# Patient Record
Sex: Female | Born: 1940 | Race: Black or African American | Hispanic: No | Marital: Married | Smoking: Former smoker
Health system: Southern US, Community
[De-identification: ages and names within clinical notes are randomized; demographics above are authoritative.]

## PROBLEM LIST (undated history)

## (undated) DIAGNOSIS — T7840XA Allergy, unspecified, initial encounter: Secondary | ICD-10-CM

## (undated) DIAGNOSIS — N189 Chronic kidney disease, unspecified: Secondary | ICD-10-CM

## (undated) DIAGNOSIS — I10 Essential (primary) hypertension: Secondary | ICD-10-CM

## (undated) HISTORY — DX: Essential (primary) hypertension: I10

## (undated) HISTORY — PX: ABDOMINAL HYSTERECTOMY: SHX81

## (undated) HISTORY — DX: Chronic kidney disease, unspecified: N18.9

## (undated) HISTORY — PX: TONSILLECTOMY: SUR1361

## (undated) HISTORY — DX: Allergy, unspecified, initial encounter: T78.40XA

---

## 1997-11-03 ENCOUNTER — Other Ambulatory Visit: Admission: RE | Admit: 1997-11-03 | Discharge: 1997-11-03 | Payer: Self-pay | Admitting: *Deleted

## 1997-11-07 ENCOUNTER — Other Ambulatory Visit: Admission: RE | Admit: 1997-11-07 | Discharge: 1997-11-07 | Payer: Self-pay | Admitting: *Deleted

## 1997-11-28 ENCOUNTER — Ambulatory Visit (HOSPITAL_COMMUNITY): Admission: RE | Admit: 1997-11-28 | Discharge: 1997-11-28 | Payer: Self-pay | Admitting: *Deleted

## 1998-06-29 ENCOUNTER — Other Ambulatory Visit: Admission: RE | Admit: 1998-06-29 | Discharge: 1998-06-29 | Payer: Self-pay | Admitting: Obstetrics and Gynecology

## 2000-11-03 ENCOUNTER — Other Ambulatory Visit: Admission: RE | Admit: 2000-11-03 | Discharge: 2000-11-03 | Payer: Self-pay | Admitting: Obstetrics and Gynecology

## 2000-11-06 ENCOUNTER — Encounter: Payer: Self-pay | Admitting: Obstetrics and Gynecology

## 2000-11-06 ENCOUNTER — Encounter: Admission: RE | Admit: 2000-11-06 | Discharge: 2000-11-06 | Payer: Self-pay | Admitting: Obstetrics and Gynecology

## 2000-11-11 ENCOUNTER — Encounter: Admission: RE | Admit: 2000-11-11 | Discharge: 2000-11-11 | Payer: Self-pay | Admitting: Obstetrics and Gynecology

## 2000-11-11 ENCOUNTER — Other Ambulatory Visit: Admission: RE | Admit: 2000-11-11 | Discharge: 2000-11-11 | Payer: Self-pay | Admitting: *Deleted

## 2000-11-11 ENCOUNTER — Encounter: Payer: Self-pay | Admitting: Obstetrics and Gynecology

## 2000-11-11 ENCOUNTER — Encounter (INDEPENDENT_AMBULATORY_CARE_PROVIDER_SITE_OTHER): Payer: Self-pay | Admitting: Specialist

## 2010-08-17 ENCOUNTER — Ambulatory Visit: Admit: 2010-08-17 | Payer: Self-pay | Admitting: Family Medicine

## 2010-09-25 ENCOUNTER — Encounter: Payer: Self-pay | Admitting: Family Medicine

## 2010-09-25 ENCOUNTER — Ambulatory Visit (INDEPENDENT_AMBULATORY_CARE_PROVIDER_SITE_OTHER): Payer: BC Managed Care – PPO | Admitting: Family Medicine

## 2010-09-25 DIAGNOSIS — I10 Essential (primary) hypertension: Secondary | ICD-10-CM

## 2010-09-25 DIAGNOSIS — E785 Hyperlipidemia, unspecified: Secondary | ICD-10-CM

## 2010-09-25 DIAGNOSIS — J309 Allergic rhinitis, unspecified: Secondary | ICD-10-CM

## 2010-09-25 LAB — HEPATIC FUNCTION PANEL
ALT: 22 U/L (ref 0–35)
AST: 19 U/L (ref 0–37)
Albumin: 3.2 g/dL — ABNORMAL LOW (ref 3.5–5.2)
Alkaline Phosphatase: 99 U/L (ref 39–117)
Bilirubin, Direct: 0.1 mg/dL (ref 0.0–0.3)
Total Bilirubin: 0.8 mg/dL (ref 0.3–1.2)
Total Protein: 6.2 g/dL (ref 6.0–8.3)

## 2010-09-25 LAB — POCT URINALYSIS DIPSTICK
Bilirubin, UA: NEGATIVE
Glucose, UA: NEGATIVE
Ketones, UA: NEGATIVE
Leukocytes, UA: NEGATIVE
Nitrite, UA: NEGATIVE
Spec Grav, UA: 1.025
Urobilinogen, UA: 0.2
pH, UA: 5

## 2010-09-25 LAB — CBC WITH DIFFERENTIAL/PLATELET
Basophils Absolute: 0 10*3/uL (ref 0.0–0.1)
Eosinophils Absolute: 0.1 10*3/uL (ref 0.0–0.7)
HCT: 35 % — ABNORMAL LOW (ref 36.0–46.0)
Hemoglobin: 11.8 g/dL — ABNORMAL LOW (ref 12.0–15.0)
Lymphs Abs: 2 10*3/uL (ref 0.7–4.0)
MCHC: 33.6 g/dL (ref 30.0–36.0)
Neutro Abs: 3.7 10*3/uL (ref 1.4–7.7)
RDW: 13.6 % (ref 11.5–14.6)

## 2010-09-25 LAB — BASIC METABOLIC PANEL
BUN: 22 mg/dL (ref 6–23)
CO2: 22 mEq/L (ref 19–32)
Calcium: 9.2 mg/dL (ref 8.4–10.5)
Chloride: 107 mEq/L (ref 96–112)
Creatinine, Ser: 1.7 mg/dL — ABNORMAL HIGH (ref 0.4–1.2)
GFR: 37.26 mL/min — ABNORMAL LOW (ref >60.00)
Glucose, Bld: 83 mg/dL (ref 70–99)
Potassium: 4.1 mEq/L (ref 3.5–5.1)
Sodium: 139 mEq/L (ref 135–145)

## 2010-09-25 LAB — LIPID PANEL
Cholesterol: 287 mg/dL — ABNORMAL HIGH (ref 0–200)
HDL: 56.7 mg/dL (ref >39.00)
Total CHOL/HDL Ratio: 5
Triglycerides: 106 mg/dL (ref 0.0–149.0)
VLDL: 21.2 mg/dL (ref 0.0–40.0)

## 2010-09-25 MED ORDER — OLMESARTAN MEDOXOMIL-HCTZ 40-25 MG PO TABS: 1.0000 | ORAL_TABLET | Freq: Every day | ORAL | Status: DC

## 2010-09-25 NOTE — Progress Notes (Signed)
  Subjective:    Patient ID: Diane Martinez, female    DOB: [birth year], 70 y.o.   MRN: [id number]  HPI 70 yr old female to establish with [place] after she was referred to [place] by Dr. Stevphen Rochester for HTN. She has been seeing him for several months for allergies, and he noted her BP to be consistently elevated. She feels fine although she knows she is overweight. She does not exercise. She has not seen a primary care doctor for many years.    Review of Systems  Constitutional: Negative.   Respiratory: Negative.   Cardiovascular: Negative.        Objective:   Physical Exam  Constitutional: She appears well-developed and well-nourished.       Quite overweight  Neck: No thyromegaly present.  Cardiovascular: Normal rate, regular rhythm, normal heart sounds and intact distal pulses.  Exam reveals no gallop and no friction rub.   No murmur heard.      EKG normal  Pulmonary/Chest: Effort normal and breath sounds normal. No respiratory distress. She has no wheezes. She has no rales. She exhibits no tenderness.  Musculoskeletal: She exhibits no edema.          Assessment & Plan:  Start on meds and get fasting labs

## 2010-09-27 ENCOUNTER — Telehealth: Payer: Self-pay | Admitting: *Deleted

## 2010-09-27 NOTE — Telephone Encounter (Signed)
Message copied by Tor Netters on Thu Sep 27, 2010  3:54 PM ------      Message from: Dwaine Deter      Created: Tue Sep 25, 2010  5:29 PM       Normal except her chol and TG are very high, and her renal function is mildly decreased. This may be due to the HTN. Drink lots of water, exercise, watch a strict low fat diet. We will follow these closely

## 2010-09-27 NOTE — Telephone Encounter (Signed)
Left message to call back  

## 2010-10-08 NOTE — Telephone Encounter (Signed)
Pt aware of results 

## 2010-10-23 ENCOUNTER — Other Ambulatory Visit: Payer: Self-pay

## 2010-10-23 ENCOUNTER — Ambulatory Visit (INDEPENDENT_AMBULATORY_CARE_PROVIDER_SITE_OTHER): Payer: BC Managed Care – PPO | Admitting: Family Medicine

## 2010-10-23 ENCOUNTER — Encounter: Payer: Self-pay | Admitting: Family Medicine

## 2010-10-23 VITALS — BP 146/90 | Temp 98.4°F | Wt 213.0 lb

## 2010-10-23 DIAGNOSIS — E785 Hyperlipidemia, unspecified: Secondary | ICD-10-CM

## 2010-10-23 DIAGNOSIS — I10 Essential (primary) hypertension: Secondary | ICD-10-CM | POA: Insufficient documentation

## 2010-10-23 DIAGNOSIS — N289 Disorder of kidney and ureter, unspecified: Secondary | ICD-10-CM

## 2010-10-23 NOTE — Telephone Encounter (Signed)
Pt called to tell Dr Clent Ridges she wants to stay on same BP med and if you have not called refills in call to [address]

## 2010-10-23 NOTE — Progress Notes (Signed)
  Subjective:    Patient ID: Diane Martinez, female    DOB: [birth year], 70 y.o.   MRN: [id number]  HPI Here to follow up newly diagnosed HTN. For the past month she has been taking Benicar HCT, and she is doing well. She feels fine, and the BP is steadily coming down. She has made tremendous changes in her diet, and has almost totally removed all sources of sodium. Her recent labs were remarkable for a mildly elevated creatinine at 1.7 and for very high cholesterol with an LDL of over 200.    Review of Systems  Constitutional: Negative.   Respiratory: Negative.   Cardiovascular: Negative.        Objective:   Physical Exam  Constitutional: She appears well-developed and well-nourished.  Neck: No thyromegaly present.  Cardiovascular: Normal rate, regular rhythm, normal heart sounds and intact distal pulses.  Exam reveals no gallop and no friction rub.   No murmur heard. Pulmonary/Chest: Effort normal and breath sounds normal. No respiratory distress. She has no wheezes. She has no rales. She exhibits no tenderness.  Lymphadenopathy:    She has no cervical adenopathy.          Assessment & Plan:  Continue current meds and diet. Try to lose weight. Recheck with labs in 2 months

## 2010-10-24 MED ORDER — OLMESARTAN MEDOXOMIL-HCTZ 40-25 MG PO TABS: 1.0000 | ORAL_TABLET | Freq: Every day | ORAL | Status: DC

## 2010-10-24 NOTE — Telephone Encounter (Signed)
Call in Benicar HCT 40-25 qd, #30 with 11 rf

## 2010-10-24 NOTE — Telephone Encounter (Signed)
rx sent in to [facility] [address]  For benicar [phone]

## 2010-12-25 ENCOUNTER — Ambulatory Visit (INDEPENDENT_AMBULATORY_CARE_PROVIDER_SITE_OTHER): Payer: BC Managed Care – PPO | Admitting: Family Medicine

## 2010-12-25 ENCOUNTER — Encounter: Payer: Self-pay | Admitting: Family Medicine

## 2010-12-25 VITALS — BP 132/72 | HR 74 | Temp 98.0°F | Resp 14 | Wt 212.0 lb

## 2010-12-25 DIAGNOSIS — I10 Essential (primary) hypertension: Secondary | ICD-10-CM

## 2010-12-25 DIAGNOSIS — E785 Hyperlipidemia, unspecified: Secondary | ICD-10-CM

## 2010-12-25 LAB — BASIC METABOLIC PANEL
CO2: 25 mEq/L (ref 19–32)
GFR: 26.2 mL/min — ABNORMAL LOW (ref >60.00)
Glucose, Bld: 101 mg/dL — ABNORMAL HIGH (ref 70–99)
Potassium: 4.5 mEq/L (ref 3.5–5.1)
Sodium: 143 mEq/L (ref 135–145)

## 2010-12-25 LAB — LIPID PANEL: HDL: 48.1 mg/dL (ref >39.00)

## 2010-12-25 NOTE — Progress Notes (Signed)
  Subjective:    Patient ID: Diane Martinez, female    DOB: [birth year], 70 y.o.   MRN: [id number]  HPI Here to follow up on HTN and mild renal insufficiency. At her last visit her creatinine was 1.7 with a GFR of 37. She is watching a strict diet and drinking water.    Review of Systems  Constitutional: Negative.   Respiratory: Negative.   Cardiovascular: Negative.        Objective:   Physical Exam  Constitutional: She appears well-developed and well-nourished.  Neck: No thyromegaly present.  Cardiovascular: Normal rate, regular rhythm, normal heart sounds and intact distal pulses.   Pulmonary/Chest: Effort normal and breath sounds normal.  Lymphadenopathy:    She has no cervical adenopathy.          Assessment & Plan:  Check fasting labs today

## 2011-01-01 NOTE — Progress Notes (Signed)
Call placed to patient at [phone], she was advised per Dr Claris Che instructions and has verbalized understanding and agrees as instructed

## 2011-06-27 ENCOUNTER — Encounter: Payer: Self-pay | Admitting: Family Medicine

## 2011-06-27 ENCOUNTER — Ambulatory Visit (INDEPENDENT_AMBULATORY_CARE_PROVIDER_SITE_OTHER): Payer: BC Managed Care – PPO | Admitting: Family Medicine

## 2011-06-27 VITALS — BP 136/86 | HR 73 | Temp 98.7°F | Wt 214.0 lb

## 2011-06-27 DIAGNOSIS — N289 Disorder of kidney and ureter, unspecified: Secondary | ICD-10-CM

## 2011-06-27 DIAGNOSIS — I10 Essential (primary) hypertension: Secondary | ICD-10-CM

## 2011-06-27 NOTE — Progress Notes (Signed)
  Subjective:    Patient ID: Diane Martinez, female    DOB: 1941-04-21, 70 y.o.   MRN: [id number]  HPI Here for follow up. She feels fine and has no concerns. 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 We had intended to refer her to Nephrology last summer but it appears that this was not done.    Review of Systems  Constitutional: Negative.   Respiratory: Negative.   Cardiovascular: Negative.        Objective:   Physical Exam  Constitutional: She appears well-developed and well-nourished.  Cardiovascular: Normal rate, regular rhythm, normal heart sounds and intact distal pulses.   Pulmonary/Chest: Effort normal and breath sounds normal.          Assessment & Plan:  She is doing well. Try to lose some weight. Refer to Nephrology

## 2011-09-02 ENCOUNTER — Other Ambulatory Visit: Payer: Self-pay | Admitting: Nephrology

## 2011-09-02 DIAGNOSIS — N184 Chronic kidney disease, stage 4 (severe): Secondary | ICD-10-CM

## 2011-09-04 ENCOUNTER — Other Ambulatory Visit: Payer: BC Managed Care – PPO

## 2011-09-05 ENCOUNTER — Ambulatory Visit
Admission: RE | Admit: 2011-09-05 | Discharge: 2011-09-05 | Disposition: A | Discharge status: Home / Self Care | Payer: Medicare Other | Source: Ambulatory Visit | Attending: Nephrology | Admitting: Nephrology

## 2011-09-05 DIAGNOSIS — N184 Chronic kidney disease, stage 4 (severe): Secondary | ICD-10-CM

## 2011-09-27 ENCOUNTER — Ambulatory Visit (INDEPENDENT_AMBULATORY_CARE_PROVIDER_SITE_OTHER): Payer: Medicare Other | Admitting: Family Medicine

## 2011-09-27 ENCOUNTER — Encounter: Payer: Self-pay | Admitting: Family Medicine

## 2011-09-27 VITALS — BP 136/78 | HR 88 | Temp 98.2°F | Ht 65.0 in | Wt 196.0 lb

## 2011-09-27 DIAGNOSIS — N184 Chronic kidney disease, stage 4 (severe): Secondary | ICD-10-CM

## 2011-09-27 DIAGNOSIS — I10 Essential (primary) hypertension: Secondary | ICD-10-CM

## 2011-09-27 DIAGNOSIS — E538 Deficiency of other specified B group vitamins: Secondary | ICD-10-CM

## 2011-09-27 DIAGNOSIS — Z0000 Encounter for general adult medical examination without abnormal findings: Secondary | ICD-10-CM

## 2011-09-27 LAB — CBC WITH DIFFERENTIAL/PLATELET
Basophils Absolute: 0 10*3/uL (ref 0.0–0.1)
Lymphocytes Relative: 27.4 % (ref 12.0–46.0)
Monocytes Relative: 4.9 % (ref 3.0–12.0)
Neutrophils Relative %: 61.2 % (ref 43.0–77.0)
Platelets: 189 10*3/uL (ref 150.0–400.0)
RDW: 16.1 % — ABNORMAL HIGH (ref 11.5–14.6)

## 2011-09-27 LAB — HEPATIC FUNCTION PANEL
ALT: 14 U/L (ref 0–35)
Albumin: 3.6 g/dL (ref 3.5–5.2)
Total Bilirubin: 0.2 mg/dL — ABNORMAL LOW (ref 0.3–1.2)
Total Protein: 8.1 g/dL (ref 6.0–8.3)

## 2011-09-27 LAB — POCT URINALYSIS DIPSTICK
Ketones, UA: NEGATIVE
Leukocytes, UA: NEGATIVE
Nitrite, UA: NEGATIVE
Urobilinogen, UA: 0.2

## 2011-09-27 LAB — LIPID PANEL
Cholesterol: 291 mg/dL — ABNORMAL HIGH (ref 0–200)
Total CHOL/HDL Ratio: 5
Triglycerides: 103 mg/dL (ref 0.0–149.0)

## 2011-09-27 LAB — BASIC METABOLIC PANEL
CO2: 18 mEq/L — ABNORMAL LOW (ref 19–32)
Calcium: 9.4 mg/dL (ref 8.4–10.5)
Creatinine, Ser: 2.6 mg/dL — ABNORMAL HIGH (ref 0.4–1.2)

## 2011-09-27 LAB — VITAMIN B12: Vitamin B-12: 278 pg/mL (ref 211–911)

## 2011-09-27 LAB — TSH: TSH: 0.95 u[IU]/mL (ref 0.35–5.50)

## 2011-09-27 NOTE — Progress Notes (Signed)
  Subjective:    Patient ID: Diane Martinez, female    DOB: 10-07-1940, 71 y.o.   MRN: [id number]  HPI 71 yr old female for a cpx. She feels fine and has no concerns. She has been seeing Dr. Kathrene Bongo for kidney disease. She was switched from Benicar to Hydralazine, and her BP has been well controlled. She was also taken off all NSAIDs, and her proteinuria has been decreasing. She is due for a mammogram, and she has never had a colonoscopy. She recently retired and is enjoying her free time.    Review of Systems  Constitutional: Negative.   HENT: Negative.   Eyes: Negative.   Respiratory: Negative.   Cardiovascular: Negative.   Gastrointestinal: Negative.   Genitourinary: Negative for dysuria, urgency, frequency, hematuria, flank pain, decreased urine volume, enuresis, difficulty urinating, pelvic pain and dyspareunia.  Musculoskeletal: Negative.   Skin: Negative.   Neurological: Negative.   Hematological: Negative.   Psychiatric/Behavioral: Negative.        Objective:   Physical Exam  Constitutional: She is oriented to person, place, and time. She appears well-developed and well-nourished. No distress.  HENT:  Head: Normocephalic and atraumatic.  Right Ear: External ear normal.  Left Ear: External ear normal.  Nose: Nose normal.  Mouth/Throat: Oropharynx is clear and moist. No oropharyngeal exudate.  Eyes: Conjunctivae and EOM are normal. Pupils are equal, round, and reactive to light. No scleral icterus.  Neck: Normal range of motion. Neck supple. No JVD present. No thyromegaly present.  Cardiovascular: Normal rate, regular rhythm, normal heart sounds and intact distal pulses.  Exam reveals no gallop and no friction rub.   No murmur heard.      EKG normal with a single PVC  Pulmonary/Chest: Effort normal and breath sounds normal. No respiratory distress. She has no wheezes. She has no rales. She exhibits no tenderness.  Abdominal: Soft. Bowel sounds are normal. She exhibits  no distension and no mass. There is no tenderness. There is no rebound and no guarding.  Genitourinary: No breast swelling, tenderness, discharge or bleeding.  Musculoskeletal: Normal range of motion. She exhibits no edema and no tenderness.  Lymphadenopathy:    She has no cervical adenopathy.  Neurological: She is alert and oriented to person, place, and time. She has normal reflexes. No cranial nerve deficit. She exhibits normal muscle tone. Coordination normal.  Skin: Skin is warm and dry. No rash noted. No erythema.  Psychiatric: She has a normal mood and affect. Her behavior is normal. Judgment and thought content normal.          Assessment & Plan:  Well exam. Get fasting labs. She will set up a mammogram. We will set up a colonoscopy.

## 2011-10-02 ENCOUNTER — Encounter: Payer: Self-pay | Admitting: Gastroenterology

## 2011-10-02 ENCOUNTER — Encounter: Payer: Self-pay | Admitting: Family Medicine

## 2011-10-02 NOTE — Progress Notes (Signed)
Quick Note:  I tried to reach pt by phone, no answer. I put a copy of lab results in mail. ______

## 2011-11-05 ENCOUNTER — Ambulatory Visit (AMBULATORY_SURGERY_CENTER): Payer: Medicare Other

## 2011-11-05 VITALS — Ht 66.0 in | Wt 203.4 lb

## 2011-11-05 DIAGNOSIS — Z1211 Encounter for screening for malignant neoplasm of colon: Secondary | ICD-10-CM

## 2011-11-05 MED ORDER — PEG-KCL-NACL-NASULF-NA ASC-C 100 G PO SOLR: 1.0000 | Freq: Once | ORAL | Status: AC

## 2011-11-19 ENCOUNTER — Ambulatory Visit (AMBULATORY_SURGERY_CENTER): Payer: Medicare Other | Admitting: Gastroenterology

## 2011-11-19 ENCOUNTER — Encounter: Payer: Self-pay | Admitting: Gastroenterology

## 2011-11-19 VITALS — BP 171/82 | HR 67 | Temp 96.4°F | Resp 20 | Ht 66.0 in | Wt 203.0 lb

## 2011-11-19 DIAGNOSIS — Z1211 Encounter for screening for malignant neoplasm of colon: Secondary | ICD-10-CM

## 2011-11-19 DIAGNOSIS — K5730 Diverticulosis of large intestine without perforation or abscess without bleeding: Secondary | ICD-10-CM

## 2011-11-19 DIAGNOSIS — D126 Benign neoplasm of colon, unspecified: Secondary | ICD-10-CM

## 2011-11-19 HISTORY — PX: COLONOSCOPY: SHX174

## 2011-11-19 MED ORDER — SODIUM CHLORIDE 0.9 % IV SOLN: 500.0000 mL | INTRAVENOUS | Status: DC

## 2011-11-19 NOTE — Patient Instructions (Signed)
You may resume your prior medications today.  Handouts given on polyps, diverticulosis and high fiber diet.  Please call if any questions or concerns.    YOU HAD AN ENDOSCOPIC PROCEDURE TODAY AT THE [facility]: Refer to the procedure report that was given to you for any specific questions about what was found during the examination.  If the procedure report does not answer your questions, please call your gastroenterologist to clarify.  If you requested that your care partner not be given the details of your procedure findings, then the procedure report has been included in a sealed envelope for you to review at your convenience later.  YOU SHOULD EXPECT: Some feelings of bloating in the abdomen. Passage of more gas than usual.  Walking can help get rid of the air that was put into your GI tract during the procedure and reduce the bloating. If you had a lower endoscopy (such as a colonoscopy or flexible sigmoidoscopy) you may notice spotting of blood in your stool or on the toilet paper. If you underwent a bowel prep for your procedure, then you may not have a normal bowel movement for a few days.  DIET: Your first meal following the procedure should be a light meal and then it is ok to progress to your normal diet.  A half-sandwich or bowl of soup is an example of a good first meal.  Heavy or fried foods are harder to digest and may make you feel nauseous or bloated.  Likewise meals heavy in dairy and vegetables can cause extra gas to form and this can also increase the bloating.  Drink plenty of fluids but you should avoid alcoholic beverages for 24 hours.  ACTIVITY: Your care partner should take you home directly after the procedure.  You should plan to take it easy, moving slowly for the rest of the day.  You can resume normal activity the day after the procedure however you should NOT DRIVE or use heavy machinery for 24 hours (because of the sedation medicines used during the test).     SYMPTOMS TO REPORT IMMEDIATELY: A gastroenterologist can be reached at any hour.  During normal business hours, 8:30 AM to 5:00 PM Monday through Friday, call [phone].  After hours and on weekends, please call the GI answering service at [phone] who will take a message and have the physician on call contact you.   Following lower endoscopy (colonoscopy or flexible sigmoidoscopy):  Excessive amounts of blood in the stool  Significant tenderness or worsening of abdominal pains  Swelling of the abdomen that is new, acute  Fever of 100F or higher    FOLLOW UP: If any biopsies were taken you will be contacted by phone or by letter within the next 1-3 weeks.  Call your gastroenterologist if you have not heard about the biopsies in 3 weeks.  Our staff will call the home number listed on your records the next business day following your procedure to check on you and address any questions or concerns that you may have at that time regarding the information given to you following your procedure. This is a courtesy call and so if there is no answer at the home number and we have not heard from you through the emergency physician on call, we will assume that you have returned to your regular daily activities without incident.  SIGNATURES/CONFIDENTIALITY: You and/or your care partner have signed paperwork which will be entered into your electronic medical record.  These signatures attest to the fact that that the information above on your After Visit Summary has been reviewed and is understood.  Full responsibility of the confidentiality of this discharge information lies with you and/or your care-partner.  

## 2011-11-19 NOTE — Op Note (Signed)
[facility] [address] [place], [place]  [address]  COLONOSCOPY PROCEDURE REPORT  PATIENT:  Diane Martinez, Diane Martinez  MR#:  [id number] BIRTHDATE:  [birth year], 70 yrs. old  GENDER:  female ENDOSCOPIST:  Vania Rea. Jarold Motto, MD, [facility] REF. BY:  Tera Mater. Clent Ridges, M.D. PROCEDURE DATE:  11/19/2011 PROCEDURE:  Colonoscopy with snare polypectomy ASA CLASS:  Class III INDICATIONS:  Routine Risk Screening MEDICATIONS:   propofol (Diprivan) 200 mg IV  DESCRIPTION OF PROCEDURE:   After the risks and benefits and of the procedure were explained, informed consent was obtained. Digital rectal exam was performed and revealed no abnormalities. The LB PCF-H180AL [id number] endoscope was introduced through the anus and advanced to the cecum, which was identified by both the appendix and ileocecal valve.  The quality of the prep was excellent, using MoviPrep.  The instrument was then slowly withdrawn as the colon was fully examined. <<PROCEDUREIMAGES>>  FINDINGS:  ENDOSCOPIC FINDINGS:   A sessile polyp was found in the mid transverse colon.  cm flat TV COLON POLYP HOT SNARE REMOVED.SEE PICTURES. There were mild diverticular changes in left colon. diverticulosis was found.  This was otherwise a normal examination of the colon.   Retroflexed views in the rectum revealed no abnormalities.    The scope was then withdrawn from the patient and the procedure completed.  COMPLICATIONS:  None ENDOSCOPIC IMPRESSION: 1) Diverticulosis,mild,left sided diverticulosis 2) Sessile polyp in the mid transverse colon 3) Otherwise normal examination R/O ADENOMA RECOMMENDATIONS: 1) Await pathology results 2) High fiber diet.  REPEAT EXAM:  No  ______________________________ Vania Rea. Jarold Motto, MD, [facility]  CC:  n. eSIGNED:   Vania Rea. Jaylee Lantry at 11/19/2011 09:48 AM  Loura Pardon, [phone]

## 2011-11-19 NOTE — Progress Notes (Signed)
No cpomplaints noted in the recovery room. Maw  Patient did not experience any of the following events: a burn prior to discharge; a fall within the facility; wrong site/side/patient/procedure/implant event; or a hospital transfer or hospital admission upon discharge from the facility. [phone]) Patient did not have preoperative order for IV antibiotic SSI prophylaxis. [phone])

## 2011-11-19 NOTE — Progress Notes (Signed)
PROPOFOL PER [place] [facility] CRNA. SEE SCANNED INTRA PROCEDURE REPORT. MEDS TITRATED PER CRNA AMD MD. Brandy Hale

## 2011-11-20 ENCOUNTER — Telehealth: Payer: Self-pay | Admitting: *Deleted

## 2011-11-20 NOTE — Telephone Encounter (Signed)
  Follow up Call-  Call back number 11/19/2011  Post procedure Call Back phone  # [phone]  Permission to leave phone message Yes     Patient questions:  Do you have a fever, pain , or abdominal swelling? no Pain Score  0 *  Have you tolerated food without any problems? yes  Have you been able to return to your normal activities? yes  Do you have any questions about your discharge instructions: Diet   no Medications  no Follow up visit  no  Do you have questions or concerns about your Care? no  Actions: * If pain score is 4 or above: No action needed, pain <4.

## 2011-11-25 ENCOUNTER — Encounter: Payer: Self-pay | Admitting: Gastroenterology

## 2012-04-09 ENCOUNTER — Ambulatory Visit (INDEPENDENT_AMBULATORY_CARE_PROVIDER_SITE_OTHER): Payer: Medicare Other | Admitting: Family Medicine

## 2012-04-09 ENCOUNTER — Encounter: Payer: Self-pay | Admitting: Family Medicine

## 2012-04-09 VITALS — BP 146/88 | HR 76 | Temp 98.5°F | Wt 208.0 lb

## 2012-04-09 DIAGNOSIS — I10 Essential (primary) hypertension: Secondary | ICD-10-CM

## 2012-04-09 DIAGNOSIS — N289 Disorder of kidney and ureter, unspecified: Secondary | ICD-10-CM | POA: Insufficient documentation

## 2012-04-09 MED ORDER — HYDRALAZINE HCL 50 MG PO TABS: 50.0000 mg | ORAL_TABLET | Freq: Two times a day (BID) | ORAL | Status: DC

## 2012-04-09 NOTE — Progress Notes (Signed)
  Subjective:    Patient ID: Diane Martinez, female    DOB: Oct 16, 1940, 71 y.o.   MRN: [id number]  HPI Here to follow up on HTN. She feels great with no concerns. Her BP at home is stable in the 130s over 80s. She checks this 4-5 times every day. She sees Dr. Kathrene Bongo every 3 months.    Review of Systems  Constitutional: Negative.   Respiratory: Negative.   Cardiovascular: Negative.        Objective:   Physical Exam  Constitutional: She appears well-developed and well-nourished.  Neck: No thyromegaly present.  Cardiovascular: Normal rate, regular rhythm, normal heart sounds and intact distal pulses.   Pulmonary/Chest: Effort normal and breath sounds normal.  Lymphadenopathy:    She has no cervical adenopathy.          Assessment & Plan:  Stable HTN. Continue current regimen

## 2012-04-10 ENCOUNTER — Ambulatory Visit: Payer: Medicare Other | Admitting: Family Medicine

## 2012-09-28 ENCOUNTER — Encounter: Payer: Self-pay | Admitting: Family Medicine

## 2012-09-28 ENCOUNTER — Ambulatory Visit (INDEPENDENT_AMBULATORY_CARE_PROVIDER_SITE_OTHER): Payer: Medicare Other | Admitting: Family Medicine

## 2012-09-28 VITALS — BP 146/78 | HR 88 | Temp 97.7°F | Ht 65.5 in | Wt 204.0 lb

## 2012-09-28 DIAGNOSIS — E559 Vitamin D deficiency, unspecified: Secondary | ICD-10-CM

## 2012-09-28 DIAGNOSIS — J019 Acute sinusitis, unspecified: Secondary | ICD-10-CM

## 2012-09-28 DIAGNOSIS — N289 Disorder of kidney and ureter, unspecified: Secondary | ICD-10-CM

## 2012-09-28 DIAGNOSIS — J309 Allergic rhinitis, unspecified: Secondary | ICD-10-CM

## 2012-09-28 DIAGNOSIS — I10 Essential (primary) hypertension: Secondary | ICD-10-CM

## 2012-09-28 LAB — POCT URINALYSIS DIPSTICK
Bilirubin, UA: NEGATIVE
Ketones, UA: NEGATIVE
Leukocytes, UA: NEGATIVE

## 2012-09-28 LAB — CBC WITH DIFFERENTIAL/PLATELET
Basophils Absolute: 0 10*3/uL (ref 0.0–0.1)
Hemoglobin: 13.1 g/dL (ref 12.0–15.0)
Lymphocytes Relative: 28.5 % (ref 12.0–46.0)
Monocytes Relative: 7 % (ref 3.0–12.0)
Neutro Abs: 3.8 10*3/uL (ref 1.4–7.7)
RBC: 4.87 Mil/uL (ref 3.87–5.11)
RDW: 16.1 % — ABNORMAL HIGH (ref 11.5–14.6)

## 2012-09-28 LAB — HEPATIC FUNCTION PANEL: Total Bilirubin: 0.5 mg/dL (ref 0.3–1.2)

## 2012-09-28 LAB — TSH: TSH: 0.24 u[IU]/mL — ABNORMAL LOW (ref 0.35–5.50)

## 2012-09-28 LAB — LIPID PANEL
Cholesterol: 256 mg/dL — ABNORMAL HIGH (ref 0–200)
HDL: 45.6 mg/dL (ref >39.00)
VLDL: 18 mg/dL (ref 0.0–40.0)

## 2012-09-28 LAB — BASIC METABOLIC PANEL
BUN: 30 mg/dL — ABNORMAL HIGH (ref 6–23)
Calcium: 9.6 mg/dL (ref 8.4–10.5)
Creatinine, Ser: 2.3 mg/dL — ABNORMAL HIGH (ref 0.4–1.2)
GFR: 26.85 mL/min — ABNORMAL LOW (ref >60.00)

## 2012-09-28 MED ORDER — HYDROCODONE-HOMATROPINE 5-1.5 MG/5ML PO SYRP: 5.0000 mL | ORAL_SOLUTION | ORAL | Status: DC | PRN

## 2012-09-28 MED ORDER — AZITHROMYCIN 250 MG PO TABS: ORAL_TABLET | ORAL | Status: DC

## 2012-09-28 NOTE — Progress Notes (Signed)
  Subjective:    Patient ID: Diane Martinez, female    DOB: 30-Jul-1940, 72 y.o.   MRN: [id number]  HPI 72 yr old female for a cpx. She has been doing well until 2 weeks ago. Since then she has had a lot of PND and coughing up yellow sputum. No fever. On Allegra.    Review of Systems  Constitutional: Negative.   HENT: Positive for postnasal drip and sinus pressure. Negative for hearing loss, ear pain, nosebleeds, sore throat, facial swelling, rhinorrhea, sneezing, neck pain, neck stiffness, tinnitus and ear discharge.   Eyes: Negative.   Respiratory: Positive for cough. Negative for apnea, choking, chest tightness, shortness of breath, wheezing and stridor.   Cardiovascular: Negative.   Gastrointestinal: Negative.   Genitourinary: Negative for dysuria, urgency, frequency, hematuria, flank pain, decreased urine volume, enuresis, difficulty urinating, pelvic pain and dyspareunia.  Musculoskeletal: Negative.   Skin: Negative.   Neurological: Negative.   Psychiatric/Behavioral: Negative.        Objective:   Physical Exam  Constitutional: She is oriented to person, place, and time. She appears well-developed and well-nourished. No distress.  HENT:  Head: Normocephalic and atraumatic.  Right Ear: External ear normal.  Left Ear: External ear normal.  Nose: Nose normal.  Mouth/Throat: Oropharynx is clear and moist. No oropharyngeal exudate.  Eyes: Conjunctivae and EOM are normal. Pupils are equal, round, and reactive to light. No scleral icterus.  Neck: Normal range of motion. Neck supple. No JVD present. No thyromegaly present.  Cardiovascular: Normal rate, regular rhythm, normal heart sounds and intact distal pulses.  Exam reveals no gallop and no friction rub.   No murmur heard. EKG normal except frequent PACs and PVCs   Pulmonary/Chest: Effort normal and breath sounds normal. No respiratory distress. She has no wheezes. She has no rales. She exhibits no tenderness.  Abdominal: Soft.  Bowel sounds are normal. She exhibits no distension and no mass. There is no tenderness. There is no rebound and no guarding.  Musculoskeletal: Normal range of motion. She exhibits no edema and no tenderness.  Lymphadenopathy:    She has no cervical adenopathy.  Neurological: She is alert and oriented to person, place, and time. She has normal reflexes. No cranial nerve deficit. She exhibits normal muscle tone. Coordination normal.  Skin: Skin is warm and dry. No rash noted. No erythema.  Psychiatric: She has a normal mood and affect. Her behavior is normal. Judgment and thought content normal.          Assessment & Plan:  Well exam. Get fasting labs. Treat the sinusitis with a Zpack.

## 2012-09-29 NOTE — Progress Notes (Signed)
Quick Note:  I left voice message with results. ______ 

## 2013-03-31 ENCOUNTER — Encounter: Payer: Self-pay | Admitting: Family Medicine

## 2013-03-31 ENCOUNTER — Ambulatory Visit (INDEPENDENT_AMBULATORY_CARE_PROVIDER_SITE_OTHER): Payer: Medicare Other | Admitting: Family Medicine

## 2013-03-31 VITALS — BP 140/84 | HR 84 | Temp 98.2°F | Wt 211.0 lb

## 2013-03-31 DIAGNOSIS — N289 Disorder of kidney and ureter, unspecified: Secondary | ICD-10-CM

## 2013-03-31 DIAGNOSIS — I10 Essential (primary) hypertension: Secondary | ICD-10-CM

## 2013-03-31 NOTE — Progress Notes (Signed)
  Subjective:    Patient ID: Diane Martinez, female    DOB: 1941/02/11, 72 y.o.   MRN: [id number]  HPI Here for follow up. She feels well and her BP is stable. She saw Dr. Kathrene Bongo in July and she was very pleased with her renal status.   Review of Systems  Constitutional: Negative.   Respiratory: Negative.   Cardiovascular: Negative.        Objective:   Physical Exam  Constitutional: She appears well-developed and well-nourished.  Neck: No thyromegaly present.  Cardiovascular: Normal rate, regular rhythm, normal heart sounds and intact distal pulses.   Pulmonary/Chest: Effort normal and breath sounds normal.  Lymphadenopathy:    She has no cervical adenopathy.          Assessment & Plan:  She seems to be doing very well. Her CPX will be next March.

## 2013-09-29 ENCOUNTER — Encounter: Payer: Self-pay | Admitting: Family Medicine

## 2013-09-29 ENCOUNTER — Telehealth: Payer: Self-pay | Admitting: Family Medicine

## 2013-09-29 ENCOUNTER — Ambulatory Visit (INDEPENDENT_AMBULATORY_CARE_PROVIDER_SITE_OTHER): Payer: Medicare Other | Admitting: Family Medicine

## 2013-09-29 VITALS — BP 140/90 | HR 85 | Temp 98.1°F | Ht 65.5 in | Wt 200.0 lb

## 2013-09-29 DIAGNOSIS — Z0000 Encounter for general adult medical examination without abnormal findings: Secondary | ICD-10-CM

## 2013-09-29 DIAGNOSIS — I10 Essential (primary) hypertension: Secondary | ICD-10-CM

## 2013-09-29 LAB — HEPATIC FUNCTION PANEL
ALT: 18 U/L (ref 0–35)
AST: 20 U/L (ref 0–37)
Albumin: 3.6 g/dL (ref 3.5–5.2)
Alkaline Phosphatase: 85 U/L (ref 39–117)
BILIRUBIN DIRECT: 0.1 mg/dL (ref 0.0–0.3)
BILIRUBIN TOTAL: 0.8 mg/dL (ref 0.3–1.2)
Total Protein: 7.5 g/dL (ref 6.0–8.3)

## 2013-09-29 LAB — BASIC METABOLIC PANEL
BUN: 27 mg/dL — ABNORMAL HIGH (ref 6–23)
CO2: 27 mEq/L (ref 19–32)
Calcium: 9.8 mg/dL (ref 8.4–10.5)
Chloride: 100 mEq/L (ref 96–112)
Creatinine, Ser: 2.1 mg/dL — ABNORMAL HIGH (ref 0.4–1.2)
GFR: 30.07 mL/min — ABNORMAL LOW (ref >60.00)
Glucose, Bld: 93 mg/dL (ref 70–99)
POTASSIUM: 3.4 meq/L — AB (ref 3.5–5.1)
Sodium: 139 mEq/L (ref 135–145)

## 2013-09-29 LAB — CBC WITH DIFFERENTIAL/PLATELET
BASOS ABS: 0 10*3/uL (ref 0.0–0.1)
BASOS PCT: 0.3 % (ref 0.0–3.0)
EOS ABS: 0.1 10*3/uL (ref 0.0–0.7)
Eosinophils Relative: 1.6 % (ref 0.0–5.0)
HEMATOCRIT: 42.8 % (ref 36.0–46.0)
HEMOGLOBIN: 13.7 g/dL (ref 12.0–15.0)
LYMPHS ABS: 1.5 10*3/uL (ref 0.7–4.0)
Lymphocytes Relative: 24.2 % (ref 12.0–46.0)
MCHC: 32 g/dL (ref 30.0–36.0)
MCV: 85.6 fl (ref 78.0–100.0)
Monocytes Absolute: 0.4 10*3/uL (ref 0.1–1.0)
Monocytes Relative: 6.2 % (ref 3.0–12.0)
Neutro Abs: 4.1 10*3/uL (ref 1.4–7.7)
Neutrophils Relative %: 67.7 % (ref 43.0–77.0)
Platelets: 229 10*3/uL (ref 150.0–400.0)
RBC: 5 Mil/uL (ref 3.87–5.11)
RDW: 16.4 % — ABNORMAL HIGH (ref 11.5–14.6)
WBC: 6.1 10*3/uL (ref 4.5–10.5)

## 2013-09-29 LAB — POCT URINALYSIS DIPSTICK
BILIRUBIN UA: NEGATIVE
Blood, UA: NEGATIVE
Glucose, UA: NEGATIVE
KETONES UA: NEGATIVE
Leukocytes, UA: NEGATIVE
Nitrite, UA: NEGATIVE
PH UA: 5.5
Spec Grav, UA: 1.025
Urobilinogen, UA: 0.2

## 2013-09-29 LAB — TSH: TSH: 0.95 u[IU]/mL (ref 0.35–5.50)

## 2013-09-29 LAB — LIPID PANEL
CHOL/HDL RATIO: 4
CHOLESTEROL: 219 mg/dL — AB (ref 0–200)
HDL: 50.2 mg/dL (ref >39.00)
LDL CALC: 153 mg/dL — AB (ref 0–99)
Triglycerides: 78 mg/dL (ref 0.0–149.0)
VLDL: 15.6 mg/dL (ref 0.0–40.0)

## 2013-09-29 NOTE — Progress Notes (Signed)
   Subjective:    Patient ID: Diane Martinez, female    DOB: Jun 02, 1941, 73 y.o.   MRN: [id number]  HPI 73 yr old female for a cpx. She feels well.    Review of Systems  Constitutional: Negative.   HENT: Negative.   Eyes: Negative.   Respiratory: Negative.   Cardiovascular: Negative.   Gastrointestinal: Negative.   Genitourinary: Negative for dysuria, urgency, frequency, hematuria, flank pain, decreased urine volume, enuresis, difficulty urinating, pelvic pain and dyspareunia.  Musculoskeletal: Negative.   Skin: Negative.   Neurological: Negative.   Psychiatric/Behavioral: Negative.        Objective:   Physical Exam  Constitutional: She is oriented to person, place, and time. She appears well-developed and well-nourished. No distress.  HENT:  Head: Normocephalic and atraumatic.  Right Ear: External ear normal.  Left Ear: External ear normal.  Nose: Nose normal.  Mouth/Throat: Oropharynx is clear and moist. No oropharyngeal exudate.  Eyes: Conjunctivae and EOM are normal. Pupils are equal, round, and reactive to light. No scleral icterus.  Neck: Normal range of motion. Neck supple. No JVD present. No thyromegaly present.  Cardiovascular: Normal rate, regular rhythm, normal heart sounds and intact distal pulses.  Exam reveals no gallop and no friction rub.   No murmur heard. EKG normal with a single PVC  Pulmonary/Chest: Effort normal and breath sounds normal. No respiratory distress. She has no wheezes. She has no rales. She exhibits no tenderness.  Abdominal: Soft. Bowel sounds are normal. She exhibits no distension and no mass. There is no tenderness. There is no rebound and no guarding.  Musculoskeletal: Normal range of motion. She exhibits no edema and no tenderness.  Lymphadenopathy:    She has no cervical adenopathy.  Neurological: She is alert and oriented to person, place, and time. She has normal reflexes. No cranial nerve deficit. She exhibits normal muscle tone.  Coordination normal.  Skin: Skin is warm and dry. No rash noted. No erythema.  Psychiatric: She has a normal mood and affect. Her behavior is normal. Judgment and thought content normal.          Assessment & Plan:  Well exam. Get fasting labs.

## 2013-09-29 NOTE — Telephone Encounter (Signed)
Relevant patient education mailed to patient.  

## 2013-09-29 NOTE — Progress Notes (Signed)
Pre visit review using our clinic review tool, if applicable. No additional management support is needed unless otherwise documented below in the visit note. 

## 2013-10-04 MED ORDER — POTASSIUM CHLORIDE ER 10 MEQ PO TBCR: 10.0000 meq | EXTENDED_RELEASE_TABLET | Freq: Every day | ORAL | Status: DC

## 2013-10-04 NOTE — Addendum Note (Signed)
Addended by: Aggie Hacker A on: 10/04/2013 02:39 PM   Modules accepted: Orders

## 2014-03-29 ENCOUNTER — Ambulatory Visit: Payer: Medicare Other | Admitting: Family Medicine

## 2014-04-01 ENCOUNTER — Encounter: Payer: Self-pay | Admitting: Family Medicine

## 2014-04-01 ENCOUNTER — Ambulatory Visit (INDEPENDENT_AMBULATORY_CARE_PROVIDER_SITE_OTHER): Payer: Medicare Other | Admitting: Family Medicine

## 2014-04-01 VITALS — BP 147/81 | HR 86 | Temp 98.1°F | Ht 65.5 in | Wt 210.0 lb

## 2014-04-01 DIAGNOSIS — J3089 Other allergic rhinitis: Secondary | ICD-10-CM

## 2014-04-01 DIAGNOSIS — I10 Essential (primary) hypertension: Secondary | ICD-10-CM

## 2014-04-01 DIAGNOSIS — N289 Disorder of kidney and ureter, unspecified: Secondary | ICD-10-CM

## 2014-04-01 NOTE — Progress Notes (Signed)
Pre visit review using our clinic review tool, if applicable. No additional management support is needed unless otherwise documented below in the visit note. 

## 2014-04-01 NOTE — Progress Notes (Signed)
   Subjective:    Patient ID: Diane Martinez, female    DOB: 1941/07/09, 73 y.o.   MRN: [id number]  HPI Here to follow up. She feels great with no concerns. She still works part time.   Review of Systems  Constitutional: Negative.   Respiratory: Negative.   Cardiovascular: Negative.   Neurological: Negative.        Objective:   Physical Exam  Constitutional: She appears well-developed and well-nourished.  Cardiovascular: Normal rate, regular rhythm, normal heart sounds and intact distal pulses.   Pulmonary/Chest: Effort normal and breath sounds normal.          Assessment & Plan:  She is doing well.

## 2014-10-04 ENCOUNTER — Ambulatory Visit (INDEPENDENT_AMBULATORY_CARE_PROVIDER_SITE_OTHER): Payer: Medicare Other | Admitting: Family Medicine

## 2014-10-04 ENCOUNTER — Encounter: Payer: Self-pay | Admitting: Family Medicine

## 2014-10-04 VITALS — BP 151/89 | HR 78 | Temp 98.2°F | Ht 65.5 in | Wt 213.0 lb

## 2014-10-04 DIAGNOSIS — Z0000 Encounter for general adult medical examination without abnormal findings: Secondary | ICD-10-CM | POA: Diagnosis not present

## 2014-10-04 DIAGNOSIS — I10 Essential (primary) hypertension: Secondary | ICD-10-CM | POA: Diagnosis not present

## 2014-10-04 LAB — POCT URINALYSIS DIPSTICK
BILIRUBIN UA: NEGATIVE
Glucose, UA: NEGATIVE
Ketones, UA: NEGATIVE
Nitrite, UA: NEGATIVE
PH UA: 6
RBC UA: NEGATIVE
Spec Grav, UA: 1.02
Urobilinogen, UA: 0.2

## 2014-10-04 LAB — CBC WITH DIFFERENTIAL/PLATELET
BASOS ABS: 0 10*3/uL (ref 0.0–0.1)
Basophils Relative: 0.3 % (ref 0.0–3.0)
EOS PCT: 2.9 % (ref 0.0–5.0)
Eosinophils Absolute: 0.2 10*3/uL (ref 0.0–0.7)
HCT: 43.9 % (ref 36.0–46.0)
HEMOGLOBIN: 14.5 g/dL (ref 12.0–15.0)
LYMPHS PCT: 24.4 % (ref 12.0–46.0)
Lymphs Abs: 1.5 10*3/uL (ref 0.7–4.0)
MCHC: 33 g/dL (ref 30.0–36.0)
MCV: 83.5 fl (ref 78.0–100.0)
MONOS PCT: 5.3 % (ref 3.0–12.0)
Monocytes Absolute: 0.3 10*3/uL (ref 0.1–1.0)
NEUTROS ABS: 4.1 10*3/uL (ref 1.4–7.7)
Neutrophils Relative %: 67.1 % (ref 43.0–77.0)
Platelets: 202 10*3/uL (ref 150.0–400.0)
RBC: 5.26 Mil/uL — AB (ref 3.87–5.11)
RDW: 16.4 % — ABNORMAL HIGH (ref 11.5–15.5)
WBC: 6.1 10*3/uL (ref 4.0–10.5)

## 2014-10-04 LAB — BASIC METABOLIC PANEL
BUN: 32 mg/dL — AB (ref 6–23)
CALCIUM: 10 mg/dL (ref 8.4–10.5)
CO2: 28 meq/L (ref 19–32)
CREATININE: 1.74 mg/dL — AB (ref 0.40–1.20)
Chloride: 104 mEq/L (ref 96–112)
GFR: 36.84 mL/min — ABNORMAL LOW (ref >60.00)
Glucose, Bld: 97 mg/dL (ref 70–99)
Potassium: 3.6 mEq/L (ref 3.5–5.1)
Sodium: 139 mEq/L (ref 135–145)

## 2014-10-04 LAB — LIPID PANEL
Cholesterol: 235 mg/dL — ABNORMAL HIGH (ref 0–200)
HDL: 56.7 mg/dL (ref >39.00)
LDL Cholesterol: 159 mg/dL — ABNORMAL HIGH (ref 0–99)
NonHDL: 178.3
TRIGLYCERIDES: 97 mg/dL (ref 0.0–149.0)
Total CHOL/HDL Ratio: 4
VLDL: 19.4 mg/dL (ref 0.0–40.0)

## 2014-10-04 LAB — HEPATIC FUNCTION PANEL
ALK PHOS: 96 U/L (ref 39–117)
ALT: 19 U/L (ref 0–35)
AST: 16 U/L (ref 0–37)
Albumin: 3.9 g/dL (ref 3.5–5.2)
BILIRUBIN DIRECT: 0 mg/dL (ref 0.0–0.3)
BILIRUBIN TOTAL: 0.4 mg/dL (ref 0.2–1.2)
Total Protein: 8 g/dL (ref 6.0–8.3)

## 2014-10-04 LAB — TSH: TSH: 0.89 u[IU]/mL (ref 0.35–4.50)

## 2014-10-04 MED ORDER — HYDROCHLOROTHIAZIDE 25 MG PO TABS: 25.0000 mg | ORAL_TABLET | Freq: Every day | ORAL | Status: DC

## 2014-10-04 MED ORDER — HYDRALAZINE HCL 50 MG PO TABS: 50.0000 mg | ORAL_TABLET | Freq: Two times a day (BID) | ORAL | Status: DC

## 2014-10-04 MED ORDER — POTASSIUM CHLORIDE ER 10 MEQ PO TBCR: 10.0000 meq | EXTENDED_RELEASE_TABLET | Freq: Every day | ORAL | Status: DC

## 2014-10-04 NOTE — Progress Notes (Signed)
Pre visit review using our clinic review tool, if applicable. No additional management support is needed unless otherwise documented below in the visit note. 

## 2014-10-04 NOTE — Progress Notes (Signed)
   Subjective:    Patient ID: Diane Martinez, female    DOB: 1941-04-24, 74 y.o.   MRN: [id number]  HPI 74 yr old female for a cpx. She feels well and he rBP has been stable. She watches her diet and tries to walk several days a week.    Review of Systems  Constitutional: Negative.   HENT: Negative.   Eyes: Negative.   Respiratory: Negative.   Cardiovascular: Negative.   Gastrointestinal: Negative.   Genitourinary: Negative for dysuria, urgency, frequency, hematuria, flank pain, decreased urine volume, enuresis, difficulty urinating, pelvic pain and dyspareunia.  Musculoskeletal: Negative.   Skin: Negative.   Neurological: Negative.   Psychiatric/Behavioral: Negative.        Objective:   Physical Exam  Constitutional: She is oriented to person, place, and time. She appears well-developed and well-nourished. No distress.  HENT:  Head: Normocephalic and atraumatic.  Right Ear: External ear normal.  Left Ear: External ear normal.  Nose: Nose normal.  Mouth/Throat: Oropharynx is clear and moist. No oropharyngeal exudate.  Eyes: Conjunctivae and EOM are normal. Pupils are equal, round, and reactive to light. No scleral icterus.  Neck: Normal range of motion. Neck supple. No JVD present. No thyromegaly present.  Cardiovascular: Normal rate, regular rhythm, normal heart sounds and intact distal pulses.  Exam reveals no gallop and no friction rub.   No murmur heard. Pulmonary/Chest: Effort normal and breath sounds normal. No respiratory distress. She has no wheezes. She has no rales. She exhibits no tenderness.  Abdominal: Soft. Bowel sounds are normal. She exhibits no distension and no mass. There is no tenderness. There is no rebound and no guarding.  Musculoskeletal: Normal range of motion. She exhibits no edema or tenderness.  Lymphadenopathy:    She has no cervical adenopathy.  Neurological: She is alert and oriented to person, place, and time. She has normal reflexes. No cranial  nerve deficit. She exhibits normal muscle tone. Coordination normal.  Skin: Skin is warm and dry. No rash noted. No erythema.  Psychiatric: She has a normal mood and affect. Her behavior is normal. Judgment and thought content normal.          Assessment & Plan:  Well exam. Her HTN is well controlled. We will get fasting labs to day to check her renal function among other things

## 2014-10-13 ENCOUNTER — Other Ambulatory Visit: Payer: Self-pay | Admitting: Family Medicine

## 2015-01-02 ENCOUNTER — Telehealth: Payer: Self-pay

## 2015-01-02 NOTE — Telephone Encounter (Signed)
Left message for pt to call us back (concerning her overdue mammogram) 

## 2015-01-02 NOTE — Telephone Encounter (Signed)
Pt returned call and states she will get her mammogram next year.

## 2015-04-06 ENCOUNTER — Ambulatory Visit: Payer: Medicare Other | Admitting: Family Medicine

## 2015-04-13 ENCOUNTER — Ambulatory Visit (INDEPENDENT_AMBULATORY_CARE_PROVIDER_SITE_OTHER): Payer: Medicare Other | Admitting: Family Medicine

## 2015-04-13 ENCOUNTER — Encounter: Payer: Self-pay | Admitting: Family Medicine

## 2015-04-13 VITALS — BP 152/88 | HR 80 | Temp 98.2°F | Ht 65.5 in | Wt 216.0 lb

## 2015-04-13 DIAGNOSIS — N289 Disorder of kidney and ureter, unspecified: Secondary | ICD-10-CM | POA: Diagnosis not present

## 2015-04-13 DIAGNOSIS — I10 Essential (primary) hypertension: Secondary | ICD-10-CM

## 2015-04-13 LAB — BASIC METABOLIC PANEL
BUN: 32 mg/dL — AB (ref 6–23)
CO2: 26 meq/L (ref 19–32)
Calcium: 9.8 mg/dL (ref 8.4–10.5)
Chloride: 105 mEq/L (ref 96–112)
Creatinine, Ser: 1.76 mg/dL — ABNORMAL HIGH (ref 0.40–1.20)
GFR: 36.31 mL/min — ABNORMAL LOW (ref >60.00)
GLUCOSE: 92 mg/dL (ref 70–99)
POTASSIUM: 3.9 meq/L (ref 3.5–5.1)
Sodium: 139 mEq/L (ref 135–145)

## 2015-04-13 MED ORDER — POTASSIUM CHLORIDE ER 10 MEQ PO TBCR: 10.0000 meq | EXTENDED_RELEASE_TABLET | Freq: Every day | ORAL | Status: DC

## 2015-04-13 NOTE — Progress Notes (Signed)
Pre visit review using our clinic review tool, if applicable. No additional management support is needed unless otherwise documented below in the visit note. 

## 2015-04-13 NOTE — Progress Notes (Signed)
   Subjective:    Patient ID: Diane Martinez, female    DOB: 13-Jan-1941, 74 y.o.   MRN: [id number]  HPI Here to follow up. She feels great . She saw Dr. Moshe Cipro on 03-22-15 and she was very pleased with her progress. Her last creatinine in March was down to 1.74. Her BP remains stable (she has not taken her meds yet today).    Review of Systems  Constitutional: Negative.   Respiratory: Negative.   Cardiovascular: Negative.   Genitourinary: Negative.   Neurological: Negative.        Objective:   Physical Exam  Constitutional: She is oriented to person, place, and time. She appears well-developed and well-nourished.  Neck: No thyromegaly present.  Cardiovascular: Normal rate, regular rhythm, normal heart sounds and intact distal pulses.   Pulmonary/Chest: Effort normal and breath sounds normal.  Lymphadenopathy:    She has no cervical adenopathy.  Neurological: She is alert and oriented to person, place, and time.          Assessment & Plan:  She is doing well. Her HTN is stable. We will check her renal function and potassium level with a BMET today.

## 2015-10-11 ENCOUNTER — Encounter: Payer: Self-pay | Admitting: Family Medicine

## 2015-10-11 ENCOUNTER — Ambulatory Visit (INDEPENDENT_AMBULATORY_CARE_PROVIDER_SITE_OTHER): Payer: Medicare Other | Admitting: Family Medicine

## 2015-10-11 VITALS — BP 138/80 | HR 88 | Temp 98.2°F | Ht 65.5 in | Wt 213.0 lb

## 2015-10-11 DIAGNOSIS — Z0000 Encounter for general adult medical examination without abnormal findings: Secondary | ICD-10-CM

## 2015-10-11 DIAGNOSIS — I10 Essential (primary) hypertension: Secondary | ICD-10-CM

## 2015-10-11 LAB — POC URINALSYSI DIPSTICK (AUTOMATED)
BILIRUBIN UA: NEGATIVE
GLUCOSE UA: NEGATIVE
KETONES UA: NEGATIVE
Nitrite, UA: NEGATIVE
PH UA: 5.5
Spec Grav, UA: 1.02
Urobilinogen, UA: 0.2

## 2015-10-11 LAB — BASIC METABOLIC PANEL
BUN: 26 mg/dL — AB (ref 6–23)
CO2: 30 meq/L (ref 19–32)
Calcium: 10.1 mg/dL (ref 8.4–10.5)
Chloride: 102 mEq/L (ref 96–112)
Creatinine, Ser: 1.76 mg/dL — ABNORMAL HIGH (ref 0.40–1.20)
GFR: 36.26 mL/min — ABNORMAL LOW (ref >60.00)
GLUCOSE: 111 mg/dL — AB (ref 70–99)
POTASSIUM: 3.9 meq/L (ref 3.5–5.1)
SODIUM: 140 meq/L (ref 135–145)

## 2015-10-11 LAB — CBC WITH DIFFERENTIAL/PLATELET
BASOS ABS: 0 10*3/uL (ref 0.0–0.1)
BASOS PCT: 0.4 % (ref 0.0–3.0)
EOS PCT: 3.5 % (ref 0.0–5.0)
Eosinophils Absolute: 0.2 10*3/uL (ref 0.0–0.7)
HCT: 45.1 % (ref 36.0–46.0)
HEMOGLOBIN: 14.7 g/dL (ref 12.0–15.0)
LYMPHS ABS: 1.4 10*3/uL (ref 0.7–4.0)
LYMPHS PCT: 20.9 % (ref 12.0–46.0)
MCHC: 32.6 g/dL (ref 30.0–36.0)
MCV: 84.3 fl (ref 78.0–100.0)
MONOS PCT: 5.5 % (ref 3.0–12.0)
Monocytes Absolute: 0.4 10*3/uL (ref 0.1–1.0)
NEUTROS ABS: 4.8 10*3/uL (ref 1.4–7.7)
Neutrophils Relative %: 69.7 % (ref 43.0–77.0)
Platelets: 214 10*3/uL (ref 150.0–400.0)
RBC: 5.35 Mil/uL — AB (ref 3.87–5.11)
RDW: 16.9 % — ABNORMAL HIGH (ref 11.5–15.5)
WBC: 6.9 10*3/uL (ref 4.0–10.5)

## 2015-10-11 LAB — HEPATIC FUNCTION PANEL
ALBUMIN: 4 g/dL (ref 3.5–5.2)
ALT: 16 U/L (ref 0–35)
AST: 17 U/L (ref 0–37)
Alkaline Phosphatase: 112 U/L (ref 39–117)
Bilirubin, Direct: 0.1 mg/dL (ref 0.0–0.3)
TOTAL PROTEIN: 7.7 g/dL (ref 6.0–8.3)
Total Bilirubin: 0.4 mg/dL (ref 0.2–1.2)

## 2015-10-11 LAB — LIPID PANEL
CHOLESTEROL: 255 mg/dL — AB (ref 0–200)
HDL: 61 mg/dL (ref >39.00)
LDL Cholesterol: 167 mg/dL — ABNORMAL HIGH (ref 0–99)
NonHDL: 194.17
Total CHOL/HDL Ratio: 4
Triglycerides: 135 mg/dL (ref 0.0–149.0)
VLDL: 27 mg/dL (ref 0.0–40.0)

## 2015-10-11 LAB — TSH: TSH: 1.03 u[IU]/mL (ref 0.35–4.50)

## 2015-10-11 NOTE — Progress Notes (Signed)
   Subjective:    Patient ID: Diane Martinez, female    DOB: 1941-07-13, 75 y.o.   MRN: [id number]  HPI 75 yr old female for a cpx. She feels well. Her BP has been stable.    Review of Systems  Constitutional: Negative.   HENT: Negative.   Eyes: Negative.   Respiratory: Negative.   Cardiovascular: Negative.   Gastrointestinal: Negative.   Genitourinary: Negative for dysuria, urgency, frequency, hematuria, flank pain, decreased urine volume, enuresis, difficulty urinating, pelvic pain and dyspareunia.  Musculoskeletal: Negative.   Skin: Negative.   Neurological: Negative.   Psychiatric/Behavioral: Negative.        Objective:   Physical Exam  Constitutional: She is oriented to person, place, and time. She appears well-developed and well-nourished. No distress.  HENT:  Head: Normocephalic and atraumatic.  Right Ear: External ear normal.  Left Ear: External ear normal.  Nose: Nose normal.  Mouth/Throat: Oropharynx is clear and moist. No oropharyngeal exudate.  Eyes: Conjunctivae and EOM are normal. Pupils are equal, round, and reactive to light. No scleral icterus.  Neck: Normal range of motion. Neck supple. No JVD present. No thyromegaly present.  Cardiovascular: Normal rate, normal heart sounds and intact distal pulses.  Exam reveals no gallop and no friction rub.   No murmur heard. Frequent ectopy. EKG shows sinus with frequent PACs   Pulmonary/Chest: Effort normal and breath sounds normal. No respiratory distress. She has no wheezes. She has no rales. She exhibits no tenderness.  Abdominal: Soft. Bowel sounds are normal. She exhibits no distension and no mass. There is no tenderness. There is no rebound and no guarding.  Musculoskeletal: Normal range of motion. She exhibits no edema or tenderness.  Lymphadenopathy:    She has no cervical adenopathy.  Neurological: She is alert and oriented to person, place, and time. She has normal reflexes. No cranial nerve deficit. She  exhibits normal muscle tone. Coordination normal.  Skin: Skin is warm and dry. No rash noted. No erythema.  Psychiatric: She has a normal mood and affect. Her behavior is normal. Judgment and thought content normal.          Assessment & Plan:  Well exam. Get fasting labs. We discussed diet and exercise.

## 2015-10-11 NOTE — Progress Notes (Signed)
Pre visit review using our clinic review tool, if applicable. No additional management support is needed unless otherwise documented below in the visit note. 

## 2016-04-11 ENCOUNTER — Encounter: Payer: Self-pay | Admitting: Family Medicine

## 2016-04-11 ENCOUNTER — Ambulatory Visit (INDEPENDENT_AMBULATORY_CARE_PROVIDER_SITE_OTHER): Payer: Medicare Other | Admitting: Family Medicine

## 2016-04-11 VITALS — BP 163/95 | HR 84 | Temp 97.9°F | Ht 65.5 in | Wt 214.0 lb

## 2016-04-11 DIAGNOSIS — I10 Essential (primary) hypertension: Secondary | ICD-10-CM | POA: Diagnosis not present

## 2016-04-11 MED ORDER — POTASSIUM CHLORIDE ER 10 MEQ PO TBCR: 10.0000 meq | EXTENDED_RELEASE_TABLET | Freq: Every day | ORAL | 3 refills | Status: DC

## 2016-04-11 NOTE — Progress Notes (Signed)
   Subjective:    Patient ID: Diane Martinez, female    DOB: [birth year], 75 y.o.   MRN: [id number]  HPI Here to follow up on BP. She feels great and has no concerns. Her BP at home runs 110-134 over 80s .   Review of Systems  Constitutional: Negative.   Respiratory: Negative.   Cardiovascular: Negative.   Neurological: Negative.        Objective:   Physical Exam  Constitutional: She is oriented to person, place, and time. She appears well-nourished.  Neck: No thyromegaly present.  Cardiovascular: Normal rate, normal heart sounds and intact distal pulses.   No murmur heard. Occasional ectopy   Pulmonary/Chest: Effort normal and breath sounds normal.  Musculoskeletal: She exhibits no edema.  Lymphadenopathy:    She has no cervical adenopathy.  Neurological: She is alert and oriented to person, place, and time.          Assessment & Plan:  HTN is well controlled. She will return in 6 months.  Laurey Morale, MD

## 2016-04-11 NOTE — Progress Notes (Signed)
Pre visit review using our clinic review tool, if applicable. No additional management support is needed unless otherwise documented below in the visit note. 

## 2016-04-30 ENCOUNTER — Other Ambulatory Visit: Payer: Self-pay | Admitting: Family Medicine

## 2016-10-09 ENCOUNTER — Encounter: Payer: Self-pay | Admitting: Family Medicine

## 2016-10-09 ENCOUNTER — Ambulatory Visit (INDEPENDENT_AMBULATORY_CARE_PROVIDER_SITE_OTHER): Payer: Medicare Other | Admitting: Family Medicine

## 2016-10-09 VITALS — BP 150/90 | HR 77 | Temp 97.9°F | Ht 65.5 in | Wt 204.0 lb

## 2016-10-09 DIAGNOSIS — N289 Disorder of kidney and ureter, unspecified: Secondary | ICD-10-CM

## 2016-10-09 DIAGNOSIS — R739 Hyperglycemia, unspecified: Secondary | ICD-10-CM

## 2016-10-09 DIAGNOSIS — I10 Essential (primary) hypertension: Secondary | ICD-10-CM

## 2016-10-09 LAB — BASIC METABOLIC PANEL
BUN: 27 mg/dL — ABNORMAL HIGH (ref 6–23)
CALCIUM: 10.2 mg/dL (ref 8.4–10.5)
CHLORIDE: 104 meq/L (ref 96–112)
CO2: 29 mEq/L (ref 19–32)
CREATININE: 1.75 mg/dL — AB (ref 0.40–1.20)
GFR: 36.4 mL/min — AB (ref >60.00)
Glucose, Bld: 91 mg/dL (ref 70–99)
Potassium: 4.1 mEq/L (ref 3.5–5.1)
Sodium: 141 mEq/L (ref 135–145)

## 2016-10-09 LAB — LIPID PANEL
CHOL/HDL RATIO: 5
Cholesterol: 252 mg/dL — ABNORMAL HIGH (ref 0–200)
HDL: 53.4 mg/dL (ref >39.00)
LDL CALC: 177 mg/dL — AB (ref 0–99)
NonHDL: 198.52
Triglycerides: 109 mg/dL (ref 0.0–149.0)
VLDL: 21.8 mg/dL (ref 0.0–40.0)

## 2016-10-09 LAB — CBC WITH DIFFERENTIAL/PLATELET
BASOS ABS: 0 10*3/uL (ref 0.0–0.1)
Basophils Relative: 0.3 % (ref 0.0–3.0)
Eosinophils Absolute: 0.2 10*3/uL (ref 0.0–0.7)
Eosinophils Relative: 2.8 % (ref 0.0–5.0)
HCT: 43.4 % (ref 36.0–46.0)
Hemoglobin: 13.9 g/dL (ref 12.0–15.0)
LYMPHS ABS: 1.5 10*3/uL (ref 0.7–4.0)
Lymphocytes Relative: 23.6 % (ref 12.0–46.0)
MCHC: 32 g/dL (ref 30.0–36.0)
MCV: 84.8 fl (ref 78.0–100.0)
MONO ABS: 0.4 10*3/uL (ref 0.1–1.0)
MONOS PCT: 5.9 % (ref 3.0–12.0)
NEUTROS PCT: 67.4 % (ref 43.0–77.0)
Neutro Abs: 4.2 10*3/uL (ref 1.4–7.7)
PLATELETS: 227 10*3/uL (ref 150.0–400.0)
RBC: 5.12 Mil/uL — AB (ref 3.87–5.11)
RDW: 15.9 % — ABNORMAL HIGH (ref 11.5–15.5)
WBC: 6.2 10*3/uL (ref 4.0–10.5)

## 2016-10-09 LAB — POC URINALSYSI DIPSTICK (AUTOMATED)
Bilirubin, UA: NEGATIVE
Blood, UA: NEGATIVE
Glucose, UA: NEGATIVE
KETONES UA: NEGATIVE
Nitrite, UA: NEGATIVE
PH UA: 5.5
Spec Grav, UA: 1.015
UROBILINOGEN UA: 0.2

## 2016-10-09 LAB — TSH: TSH: 0.94 u[IU]/mL (ref 0.35–4.50)

## 2016-10-09 LAB — HEMOGLOBIN A1C: Hgb A1c MFr Bld: 6.1 % (ref 4.6–6.5)

## 2016-10-09 LAB — HEPATIC FUNCTION PANEL
ALT: 15 U/L (ref 0–35)
AST: 17 U/L (ref 0–37)
Albumin: 4.1 g/dL (ref 3.5–5.2)
Alkaline Phosphatase: 110 U/L (ref 39–117)
Bilirubin, Direct: 0.1 mg/dL (ref 0.0–0.3)
Total Bilirubin: 0.5 mg/dL (ref 0.2–1.2)
Total Protein: 7.7 g/dL (ref 6.0–8.3)

## 2016-10-09 NOTE — Progress Notes (Signed)
Pre visit review using our clinic review tool, if applicable. No additional management support is needed unless otherwise documented below in the visit note. 

## 2016-10-09 NOTE — Patient Instructions (Signed)
WE NOW OFFER   [facility] FAST TRACK!!!  SAME DAY Appointments for ACUTE CARE  Such as: Sprains, Injuries, cuts, abrasions, rashes, muscle pain, joint pain, back pain Colds, flu, sore throats, headache, allergies, cough, fever  Ear pain, sinus and eye infections Abdominal pain, nausea, vomiting, diarrhea, upset stomach Animal/insect bites  3 Easy Ways to Schedule: Walk-In Scheduling Call in scheduling Mychart Sign-up: [email or web address]         

## 2016-10-09 NOTE — Progress Notes (Signed)
   Subjective:    Patient ID: Diane Martinez, female    DOB: 1941/06/15, 76 y.o.   MRN: [id number]  HPI 76 yr old female to follow up. She feels well and has no complaints. Her BP at home is stable, in fact this am she got 126/66. She stays active and she still works part time.    Review of Systems  Constitutional: Negative.   HENT: Negative.   Eyes: Negative.   Respiratory: Negative.   Cardiovascular: Negative.   Gastrointestinal: Negative.   Genitourinary: Negative for decreased urine volume, difficulty urinating, dyspareunia, dysuria, enuresis, flank pain, frequency, hematuria, pelvic pain and urgency.  Musculoskeletal: Negative.   Skin: Negative.   Neurological: Negative.   Psychiatric/Behavioral: Negative.        Objective:   Physical Exam  Constitutional: She is oriented to person, place, and time. She appears well-developed and well-nourished. No distress.  HENT:  Head: Normocephalic and atraumatic.  Right Ear: External ear normal.  Left Ear: External ear normal.  Nose: Nose normal.  Mouth/Throat: Oropharynx is clear and moist. No oropharyngeal exudate.  Eyes: Conjunctivae and EOM are normal. Pupils are equal, round, and reactive to light. No scleral icterus.  Neck: Normal range of motion. Neck supple. No JVD present. No thyromegaly present.  Cardiovascular: Normal rate, regular rhythm, normal heart sounds and intact distal pulses.  Exam reveals no gallop and no friction rub.   No murmur heard. Pulmonary/Chest: Effort normal and breath sounds normal. No respiratory distress. She has no wheezes. She has no rales. She exhibits no tenderness.  Abdominal: Soft. Bowel sounds are normal. She exhibits no distension and no mass. There is no tenderness. There is no rebound and no guarding.  Musculoskeletal: Normal range of motion. She exhibits no edema or tenderness.  Lymphadenopathy:    She has no cervical adenopathy.  Neurological: She is alert and oriented to person, place,  and time. She has normal reflexes. No cranial nerve deficit. She exhibits normal muscle tone. Coordination normal.  Skin: Skin is warm and dry. No rash noted. No erythema.  Psychiatric: She has a normal mood and affect. Her behavior is normal. Judgment and thought content normal.          Assessment & Plan:  Her HTN is stable. We will send her for fasting labs today to check her renal function among other things.  Alysia Penna, MD

## 2017-02-16 ENCOUNTER — Other Ambulatory Visit: Payer: Self-pay | Admitting: Family Medicine

## 2017-02-17 NOTE — Telephone Encounter (Signed)
Can we refill this? 

## 2017-03-17 ENCOUNTER — Ambulatory Visit: Payer: Medicare Other | Admitting: Family Medicine

## 2017-03-17 ENCOUNTER — Telehealth: Payer: Self-pay | Admitting: Family Medicine

## 2017-03-17 NOTE — Telephone Encounter (Signed)
Dr. Sarajane Jews received a fax from pt's eye doctor, Dr. Katy Fitch and he states her doctor found something upon exam that he wanted Dr. Sarajane Jews to follow up on. Per Dr. Sarajane Jews he wanted to see if pt could come in today to see him.   Unable to reach pt. Left vm to return our call

## 2017-03-17 NOTE — Telephone Encounter (Signed)
Looks like pt rescheduled her appt and she will be coming into the office on 8/21. Letter from Dr. Zenia Resides office placed with Dr. Barbie Banner papers on the counter

## 2017-03-17 NOTE — Telephone Encounter (Signed)
Pt has been sch for today at 3 pm

## 2017-03-17 NOTE — Telephone Encounter (Signed)
Dr. Sarajane Jews is aware that she is not coming in today as well

## 2017-03-18 ENCOUNTER — Ambulatory Visit (INDEPENDENT_AMBULATORY_CARE_PROVIDER_SITE_OTHER): Payer: Medicare Other | Admitting: Family Medicine

## 2017-03-18 ENCOUNTER — Encounter: Payer: Self-pay | Admitting: Family Medicine

## 2017-03-18 VITALS — BP 140/90 | HR 94 | Temp 98.0°F | Ht 65.5 in | Wt 201.0 lb

## 2017-03-18 DIAGNOSIS — E782 Mixed hyperlipidemia: Secondary | ICD-10-CM

## 2017-03-18 DIAGNOSIS — G3281 Cerebellar ataxia in diseases classified elsewhere: Secondary | ICD-10-CM | POA: Diagnosis not present

## 2017-03-18 DIAGNOSIS — I10 Essential (primary) hypertension: Secondary | ICD-10-CM | POA: Diagnosis not present

## 2017-03-18 DIAGNOSIS — H34211 Partial retinal artery occlusion, right eye: Secondary | ICD-10-CM | POA: Insufficient documentation

## 2017-03-18 DIAGNOSIS — E785 Hyperlipidemia, unspecified: Secondary | ICD-10-CM | POA: Insufficient documentation

## 2017-03-18 LAB — BASIC METABOLIC PANEL
BUN: 26 mg/dL — AB (ref 6–23)
CO2: 31 mEq/L (ref 19–32)
Calcium: 9.5 mg/dL (ref 8.4–10.5)
Chloride: 102 mEq/L (ref 96–112)
Creatinine, Ser: 1.82 mg/dL — ABNORMAL HIGH (ref 0.40–1.20)
GFR: 34.75 mL/min — AB (ref >60.00)
Glucose, Bld: 117 mg/dL — ABNORMAL HIGH (ref 70–99)
POTASSIUM: 3.6 meq/L (ref 3.5–5.1)
SODIUM: 139 meq/L (ref 135–145)

## 2017-03-18 MED ORDER — ASPIRIN 81 MG PO TABS: 81.0000 mg | ORAL_TABLET | Freq: Every day | ORAL | 0 refills | Status: AC

## 2017-03-18 MED ORDER — HYDROCHLOROTHIAZIDE 25 MG PO TABS: 25.0000 mg | ORAL_TABLET | Freq: Every day | ORAL | 3 refills | Status: DC

## 2017-03-18 MED ORDER — ATORVASTATIN CALCIUM 20 MG PO TABS: 20.0000 mg | ORAL_TABLET | Freq: Every day | ORAL | 3 refills | Status: DC

## 2017-03-18 NOTE — Progress Notes (Signed)
   Subjective:    Patient ID: Diane Martinez, female    DOB: December 31, 1940, 76 y.o.   MRN: [id number]  HPI Here at the request of Dr. Clent Jacks, her ophthalmologist. 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 This is associated with a higher risk of other CNS emboli than normal, so he asked [place] to evaluate her. She feels fine and her vision is not affected. Her lipids have been borderline and she has been using red yeast rice extract but she has never taken a statin. She is not on aspirin. Her BP at home has been stable in the 120s over 70s.    Review of Systems  Constitutional: Negative.   Eyes: Negative.   Respiratory: Negative.   Cardiovascular: Negative.   Neurological: Negative.        Objective:   Physical Exam  Constitutional: She is oriented to person, place, and time. She appears well-developed and well-nourished.  Neck: No thyromegaly present.  No carotid bruits   Cardiovascular: Normal rate, regular rhythm, normal heart sounds and intact distal pulses.   Occasional ectopy   Pulmonary/Chest: Effort normal and breath sounds normal. No respiratory distress. She has no wheezes. She has no rales.  Lymphadenopathy:    She has no cervical adenopathy.  Neurological: She is alert and oriented to person, place, and time.          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 She will begin taking aspirin 81 mg daily. She will start taking Lipitor 20 mg daily. Get a BMET today. Set up carotid dopplers, a brain MRI scan, and an ECHO soon to look for potential sources of emboli.  Alysia Penna, MD

## 2017-03-18 NOTE — Patient Instructions (Signed)
WE NOW OFFER   [facility] FAST TRACK!!!  SAME DAY Appointments for ACUTE CARE  Such as: Sprains, Injuries, cuts, abrasions, rashes, muscle pain, joint pain, back pain Colds, flu, sore throats, headache, allergies, cough, fever  Ear pain, sinus and eye infections Abdominal pain, nausea, vomiting, diarrhea, upset stomach Animal/insect bites  3 Easy Ways to Schedule: Walk-In Scheduling Call in scheduling Mychart Sign-up: [email or web address]         

## 2017-03-20 ENCOUNTER — Telehealth: Payer: Self-pay | Admitting: Family Medicine

## 2017-03-20 NOTE — Telephone Encounter (Signed)
I left a voice message for pt to return my call. Had lab results to discuss, chart was closed by accident.

## 2017-03-21 NOTE — Telephone Encounter (Signed)
I spoke with pt and went over results. 

## 2017-03-24 ENCOUNTER — Ambulatory Visit (HOSPITAL_COMMUNITY): Payer: Medicare Other | Attending: Cardiology

## 2017-03-24 ENCOUNTER — Other Ambulatory Visit: Payer: Self-pay

## 2017-03-24 DIAGNOSIS — I129 Hypertensive chronic kidney disease with stage 1 through stage 4 chronic kidney disease, or unspecified chronic kidney disease: Secondary | ICD-10-CM | POA: Diagnosis not present

## 2017-03-24 DIAGNOSIS — Z87891 Personal history of nicotine dependence: Secondary | ICD-10-CM | POA: Diagnosis not present

## 2017-03-24 DIAGNOSIS — N189 Chronic kidney disease, unspecified: Secondary | ICD-10-CM | POA: Insufficient documentation

## 2017-03-24 DIAGNOSIS — H34211 Partial retinal artery occlusion, right eye: Secondary | ICD-10-CM | POA: Diagnosis not present

## 2017-03-24 DIAGNOSIS — Z8249 Family history of ischemic heart disease and other diseases of the circulatory system: Secondary | ICD-10-CM | POA: Insufficient documentation

## 2017-03-24 DIAGNOSIS — I081 Rheumatic disorders of both mitral and tricuspid valves: Secondary | ICD-10-CM | POA: Insufficient documentation

## 2017-03-28 ENCOUNTER — Ambulatory Visit
Admission: RE | Admit: 2017-03-28 | Discharge: 2017-03-28 | Disposition: A | Discharge status: Home / Self Care | Payer: Medicare Other | Source: Ambulatory Visit | Attending: Family Medicine | Admitting: Family Medicine

## 2017-03-28 ENCOUNTER — Other Ambulatory Visit: Payer: Self-pay | Admitting: Family Medicine

## 2017-03-28 DIAGNOSIS — G3281 Cerebellar ataxia in diseases classified elsewhere: Secondary | ICD-10-CM

## 2017-04-09 ENCOUNTER — Ambulatory Visit (INDEPENDENT_AMBULATORY_CARE_PROVIDER_SITE_OTHER): Payer: Medicare Other | Admitting: Family Medicine

## 2017-04-09 ENCOUNTER — Encounter: Payer: Self-pay | Admitting: Family Medicine

## 2017-04-09 VITALS — BP 130/78 | Temp 98.2°F | Ht 65.5 in | Wt 200.0 lb

## 2017-04-09 DIAGNOSIS — I10 Essential (primary) hypertension: Secondary | ICD-10-CM | POA: Diagnosis not present

## 2017-04-09 DIAGNOSIS — N289 Disorder of kidney and ureter, unspecified: Secondary | ICD-10-CM | POA: Diagnosis not present

## 2017-04-09 DIAGNOSIS — E782 Mixed hyperlipidemia: Secondary | ICD-10-CM | POA: Diagnosis not present

## 2017-04-09 DIAGNOSIS — H34211 Partial retinal artery occlusion, right eye: Secondary | ICD-10-CM | POA: Diagnosis not present

## 2017-04-09 MED ORDER — POTASSIUM CHLORIDE ER 10 MEQ PO TBCR: 10.0000 meq | EXTENDED_RELEASE_TABLET | Freq: Every day | ORAL | 3 refills | Status: DC

## 2017-04-09 MED ORDER — HYDRALAZINE HCL 50 MG PO TABS: 50.0000 mg | ORAL_TABLET | Freq: Two times a day (BID) | ORAL | 3 refills | Status: DC

## 2017-04-09 NOTE — Patient Instructions (Signed)
WE NOW OFFER   [facility] FAST TRACK!!!  SAME DAY Appointments for ACUTE CARE  Such as: Sprains, Injuries, cuts, abrasions, rashes, muscle pain, joint pain, back pain Colds, flu, sore throats, headache, allergies, cough, fever  Ear pain, sinus and eye infections Abdominal pain, nausea, vomiting, diarrhea, upset stomach Animal/insect bites  3 Easy Ways to Schedule: Walk-In Scheduling Call in scheduling Mychart Sign-up: [email or web address]         

## 2017-04-09 NOTE — Progress Notes (Signed)
   Subjective:    Patient ID: Diane Martinez, female    DOB: 1941-07-18, 76 y.o.   MRN: [id number]  HPI Here to follow up. She feels fine. We saw her last month after a Hollenhorst plaque was found in the right retinal artery. She underwent an ECHO, carotid dopplers, and a brain MRI, and everything looked normal. No embolic source was identified. Since then she has been taking aspirin and Lipitor daily, along with her usual medications.    Review of Systems  Constitutional: Negative.   HENT: Negative.   Eyes: Negative.   Respiratory: Negative.   Cardiovascular: Negative.   Neurological: Negative.        Objective:   Physical Exam  Constitutional: She is oriented to person, place, and time. She appears well-developed and well-nourished.  HENT:  Right Ear: External ear normal.  Left Ear: External ear normal.  Nose: Nose normal.  Mouth/Throat: Oropharynx is clear and moist.  Eyes: Pupils are equal, round, and reactive to light. Conjunctivae and EOM are normal.  Neck: Neck supple. No thyromegaly present.  Cardiovascular: Normal rate, regular rhythm, normal heart sounds and intact distal pulses.   Pulmonary/Chest: Effort normal and breath sounds normal. No respiratory distress. She has no wheezes. She has no rales.  Neurological: She is alert and oriented to person, place, and time. No cranial nerve deficit.          Assessment & Plan:  She is doing well. She will follow up with Ophthalmology for the plaque. She will stay on Lipitor and aspirin. HTN is well controlled. She will return in 6 months for follow up and we will get fasting labs including lipids at that time.  Alysia Penna, MD

## 2017-04-10 ENCOUNTER — Telehealth: Payer: Self-pay | Admitting: Family Medicine

## 2017-04-10 NOTE — Telephone Encounter (Signed)
Pt would like to have a call back to discuss a personal matter would not elaborate.

## 2017-04-11 NOTE — Telephone Encounter (Signed)
I spoke with pt, she received a call from [place], not sure what it is about. She will replay message.

## 2017-04-22 ENCOUNTER — Inpatient Hospital Stay (HOSPITAL_COMMUNITY): Admission: RE | Admit: 2017-04-22 | Payer: Medicare Other | Source: Ambulatory Visit

## 2017-10-15 ENCOUNTER — Encounter: Payer: Self-pay | Admitting: Family Medicine

## 2017-10-15 ENCOUNTER — Ambulatory Visit (INDEPENDENT_AMBULATORY_CARE_PROVIDER_SITE_OTHER): Payer: Medicare Other | Admitting: Family Medicine

## 2017-10-15 VITALS — BP 112/70 | HR 76 | Temp 97.6°F | Ht 65.0 in | Wt 200.6 lb

## 2017-10-15 DIAGNOSIS — I10 Essential (primary) hypertension: Secondary | ICD-10-CM | POA: Diagnosis not present

## 2017-10-15 DIAGNOSIS — N289 Disorder of kidney and ureter, unspecified: Secondary | ICD-10-CM

## 2017-10-15 DIAGNOSIS — E782 Mixed hyperlipidemia: Secondary | ICD-10-CM | POA: Diagnosis not present

## 2017-10-15 LAB — CBC WITH DIFFERENTIAL/PLATELET
BASOS ABS: 0.1 10*3/uL (ref 0.0–0.1)
BASOS PCT: 1 % (ref 0.0–3.0)
Eosinophils Absolute: 0.1 10*3/uL (ref 0.0–0.7)
Eosinophils Relative: 1.8 % (ref 0.0–5.0)
HEMATOCRIT: 44.7 % (ref 36.0–46.0)
Hemoglobin: 14.7 g/dL (ref 12.0–15.0)
LYMPHS PCT: 25 % (ref 12.0–46.0)
Lymphs Abs: 1.7 10*3/uL (ref 0.7–4.0)
MCHC: 32.9 g/dL (ref 30.0–36.0)
MCV: 85.1 fl (ref 78.0–100.0)
MONOS PCT: 5.3 % (ref 3.0–12.0)
Monocytes Absolute: 0.4 10*3/uL (ref 0.1–1.0)
NEUTROS ABS: 4.6 10*3/uL (ref 1.4–7.7)
Neutrophils Relative %: 66.9 % (ref 43.0–77.0)
PLATELETS: 205 10*3/uL (ref 150.0–400.0)
RBC: 5.25 Mil/uL — ABNORMAL HIGH (ref 3.87–5.11)
RDW: 16.1 % — ABNORMAL HIGH (ref 11.5–15.5)
WBC: 6.8 10*3/uL (ref 4.0–10.5)

## 2017-10-15 LAB — POC URINALSYSI DIPSTICK (AUTOMATED)
BILIRUBIN UA: NEGATIVE
GLUCOSE UA: NEGATIVE
Ketones, UA: NEGATIVE
NITRITE UA: NEGATIVE
PH UA: 6 (ref 5.0–8.0)
Spec Grav, UA: 1.015 (ref 1.010–1.025)
Urobilinogen, UA: 0.2 E.U./dL

## 2017-10-15 LAB — BASIC METABOLIC PANEL
BUN: 29 mg/dL — ABNORMAL HIGH (ref 6–23)
CO2: 25 mEq/L (ref 19–32)
Calcium: 10.7 mg/dL — ABNORMAL HIGH (ref 8.4–10.5)
Chloride: 105 mEq/L (ref 96–112)
Creatinine, Ser: 1.94 mg/dL — ABNORMAL HIGH (ref 0.40–1.20)
GFR: 32.23 mL/min — ABNORMAL LOW (ref >60.00)
Glucose, Bld: 111 mg/dL — ABNORMAL HIGH (ref 70–99)
POTASSIUM: 4.2 meq/L (ref 3.5–5.1)
SODIUM: 149 meq/L — AB (ref 135–145)

## 2017-10-15 LAB — LIPID PANEL
CHOL/HDL RATIO: 3
CHOLESTEROL: 179 mg/dL (ref 0–200)
HDL: 65 mg/dL (ref >39.00)
LDL Cholesterol: 95 mg/dL (ref 0–99)
NonHDL: 114
Triglycerides: 97 mg/dL (ref 0.0–149.0)
VLDL: 19.4 mg/dL (ref 0.0–40.0)

## 2017-10-15 LAB — HEPATIC FUNCTION PANEL
ALK PHOS: 126 U/L — AB (ref 39–117)
ALT: 18 U/L (ref 0–35)
AST: 19 U/L (ref 0–37)
Albumin: 4.5 g/dL (ref 3.5–5.2)
BILIRUBIN DIRECT: 0.1 mg/dL (ref 0.0–0.3)
BILIRUBIN TOTAL: 0.2 mg/dL (ref 0.2–1.2)
Total Protein: 8.3 g/dL (ref 6.0–8.3)

## 2017-10-15 LAB — TSH: TSH: 1.02 u[IU]/mL (ref 0.35–4.50)

## 2017-10-15 NOTE — Progress Notes (Signed)
   Subjective:    Patient ID: Diane Martinez, female    DOB: 1941/05/07, 77 y.o.   MRN: [id number]  HPI Here to follow up on issues. She feels great and has no concerns. She sees Dr. Moshe Cipro regularly for the CKD. Her BP at home has been stable. No feet or leg swelling.    Review of Systems  Constitutional: Negative.   HENT: Negative.   Eyes: Negative.   Respiratory: Negative.   Cardiovascular: Negative.   Gastrointestinal: Negative.   Genitourinary: Negative for decreased urine volume, difficulty urinating, dyspareunia, dysuria, enuresis, flank pain, frequency, hematuria, pelvic pain and urgency.  Musculoskeletal: Negative.   Skin: Negative.   Neurological: Negative.   Psychiatric/Behavioral: Negative.        Objective:   Physical Exam  Constitutional: She is oriented to person, place, and time. She appears well-developed and well-nourished. No distress.  HENT:  Head: Normocephalic and atraumatic.  Right Ear: External ear normal.  Left Ear: External ear normal.  Nose: Nose normal.  Mouth/Throat: Oropharynx is clear and moist. No oropharyngeal exudate.  Eyes: Conjunctivae and EOM are normal. Pupils are equal, round, and reactive to light. No scleral icterus.  Neck: Normal range of motion. Neck supple. No JVD present. No thyromegaly present.  Cardiovascular: Normal rate, regular rhythm, normal heart sounds and intact distal pulses. Exam reveals no gallop and no friction rub.  No murmur heard. Pulmonary/Chest: Effort normal and breath sounds normal. No respiratory distress. She has no wheezes. She has no rales. She exhibits no tenderness.  Abdominal: Soft. Bowel sounds are normal. She exhibits no distension and no mass. There is no tenderness. There is no rebound and no guarding.  Musculoskeletal: Normal range of motion. She exhibits no edema or tenderness.  Lymphadenopathy:    She has no cervical adenopathy.  Neurological: She is alert and oriented to person, place, and  time. She has normal reflexes. No cranial nerve deficit. She exhibits normal muscle tone. Coordination normal.  Skin: Skin is warm and dry. No rash noted. No erythema.  Psychiatric: She has a normal mood and affect. Her behavior is normal. Judgment and thought content normal.          Assessment & Plan:  Her HTN is stable. She will follow up with Dr. Moshe Cipro for CKD. We will get fasting labs today to check her lipids, etc.  Alysia Penna, MD

## 2018-01-26 ENCOUNTER — Emergency Department (HOSPITAL_COMMUNITY)
Admission: EM | Admit: 2018-01-26 | Discharge: 2018-01-26 | Disposition: A | Discharge status: Home / Self Care | Payer: Medicare Other | Attending: Emergency Medicine | Admitting: Emergency Medicine

## 2018-01-26 ENCOUNTER — Ambulatory Visit (HOSPITAL_COMMUNITY): Payer: Medicare Other

## 2018-01-26 ENCOUNTER — Ambulatory Visit (HOSPITAL_COMMUNITY): Admission: RE | Admit: 2018-01-26 | Payer: Medicare Other | Source: Ambulatory Visit

## 2018-01-26 ENCOUNTER — Encounter (HOSPITAL_COMMUNITY): Payer: Self-pay

## 2018-01-26 ENCOUNTER — Other Ambulatory Visit: Payer: Self-pay

## 2018-01-26 DIAGNOSIS — I129 Hypertensive chronic kidney disease with stage 1 through stage 4 chronic kidney disease, or unspecified chronic kidney disease: Secondary | ICD-10-CM | POA: Diagnosis not present

## 2018-01-26 DIAGNOSIS — Z87891 Personal history of nicotine dependence: Secondary | ICD-10-CM | POA: Insufficient documentation

## 2018-01-26 DIAGNOSIS — Z23 Encounter for immunization: Secondary | ICD-10-CM | POA: Insufficient documentation

## 2018-01-26 DIAGNOSIS — Y929 Unspecified place or not applicable: Secondary | ICD-10-CM | POA: Insufficient documentation

## 2018-01-26 DIAGNOSIS — N189 Chronic kidney disease, unspecified: Secondary | ICD-10-CM | POA: Insufficient documentation

## 2018-01-26 DIAGNOSIS — Z7982 Long term (current) use of aspirin: Secondary | ICD-10-CM | POA: Insufficient documentation

## 2018-01-26 DIAGNOSIS — T148XXA Other injury of unspecified body region, initial encounter: Secondary | ICD-10-CM

## 2018-01-26 DIAGNOSIS — Y939 Activity, unspecified: Secondary | ICD-10-CM | POA: Diagnosis not present

## 2018-01-26 DIAGNOSIS — Y999 Unspecified external cause status: Secondary | ICD-10-CM | POA: Insufficient documentation

## 2018-01-26 DIAGNOSIS — S50812A Abrasion of left forearm, initial encounter: Secondary | ICD-10-CM | POA: Diagnosis not present

## 2018-01-26 DIAGNOSIS — S59912A Unspecified injury of left forearm, initial encounter: Secondary | ICD-10-CM | POA: Diagnosis present

## 2018-01-26 DIAGNOSIS — Z79899 Other long term (current) drug therapy: Secondary | ICD-10-CM | POA: Insufficient documentation

## 2018-01-26 MED ORDER — TETANUS-DIPHTHERIA TOXOIDS TD 5-2 LFU IM INJ
0.5000 mL | INJECTION | Freq: Once | INTRAMUSCULAR | Status: AC
  Administered 2018-01-26: 0.5 mL via INTRAMUSCULAR
  Filled 2018-01-26: qty 0.5

## 2018-01-26 NOTE — ED Notes (Signed)
ED Provider at bedside. 
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
## 2018-02-04 ENCOUNTER — Encounter: Payer: Self-pay | Admitting: Family Medicine

## 2018-02-04 ENCOUNTER — Ambulatory Visit: Payer: Medicare Other | Admitting: Family Medicine

## 2018-02-04 VITALS — BP 140/78 | HR 78 | Temp 97.8°F | Ht 68.0 in | Wt 196.8 lb

## 2018-02-04 DIAGNOSIS — S63502D Unspecified sprain of left wrist, subsequent encounter: Secondary | ICD-10-CM
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
## 2018-03-21 ENCOUNTER — Other Ambulatory Visit: Payer: Self-pay | Admitting: Family Medicine

## 2018-03-22 ENCOUNTER — Other Ambulatory Visit: Payer: Self-pay | Admitting: Family Medicine

## 2018-04-16 ENCOUNTER — Ambulatory Visit: Payer: Medicare Other | Admitting: Family Medicine

## 2018-04-16 ENCOUNTER — Encounter: Payer: Self-pay | Admitting: Family Medicine

## 2018-04-16 VITALS — BP 146/80 | HR 72 | Temp 98.1°F | Wt 200.0 lb

## 2018-04-16 DIAGNOSIS — N289 Disorder of kidney and ureter, unspecified: Secondary | ICD-10-CM

## 2018-04-16 DIAGNOSIS — I10 Essential (primary) hypertension: Secondary | ICD-10-CM | POA: Diagnosis not present

## 2018-04-16 DIAGNOSIS — E782 Mixed hyperlipidemia: Secondary | ICD-10-CM
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
## 2018-04-25 ENCOUNTER — Other Ambulatory Visit: Payer: Self-pay | Admitting: Family Medicine

## 2018-05-04 ENCOUNTER — Telehealth: Payer: Self-pay | Admitting: Family Medicine

## 2018-05-04 NOTE — Telephone Encounter (Signed)
Received a release for records from January 26, 2018 to present from [place], [place] [phone]) gave release to Margarett to process.

## 2018-05-31 ENCOUNTER — Other Ambulatory Visit: Payer: Self-pay | Admitting: Family Medicine

## 2018-06-23 ENCOUNTER — Other Ambulatory Visit: Payer: Self-pay | Admitting: Family Medicine

## 2018-06-30 NOTE — Telephone Encounter (Signed)
Pt called about refill, she is out of medication

## 2018-07-07 ENCOUNTER — Other Ambulatory Visit: Payer: Self-pay | Admitting: Family Medicine

## 2018-07-17 ENCOUNTER — Other Ambulatory Visit: Payer: Self-pay | Admitting: Family Medicine

## 2018-08-03 ENCOUNTER — Other Ambulatory Visit: Payer: Self-pay | Admitting: Family Medicine

## 2018-08-03 MED ORDER — HYDRALAZINE HCL 50 MG PO TABS: 50.0000 mg | ORAL_TABLET | Freq: Two times a day (BID) | ORAL | 3 refills | Status: DC

## 2018-08-03 NOTE — Telephone Encounter (Signed)
[facility] called and spoke to [place], [facility]. I asked did they receive the prescription sent on 06/30/18 #180/3 refills, she says it was never received.

## 2018-08-03 NOTE — Telephone Encounter (Signed)
Copied from [place] #[id number]. Topic: Quick Communication - Rx Refill/Question >> Aug 03, 2018  9:40 AM Blase Mess A wrote: Medication: hydrALAZINE (APRESOLINE) 50 MG tablet [[id number]]   Has the patient contacted their pharmacy? Yes  (Agent: If no, request that the patient contact the pharmacy for the refill.) (Agent: If yes, when and what did the pharmacy advise?)  Preferred Pharmacy (with phone number or street name): [id number] [address], [place] [phone] (Phone) [phone] (Fax)    Agent: Please be advised that RX refills may take up to 3 business days. We ask that you follow-up with your pharmacy.

## 2018-09-27 ENCOUNTER — Other Ambulatory Visit: Payer: Self-pay | Admitting: Family Medicine

## 2018-10-15 ENCOUNTER — Encounter: Payer: Medicare Other | Admitting: Family Medicine

## 2018-12-09 ENCOUNTER — Encounter: Payer: Medicare Other | Admitting: Family Medicine

## 2018-12-09 ENCOUNTER — Encounter: Payer: Self-pay | Admitting: Family Medicine

## 2018-12-09 ENCOUNTER — Ambulatory Visit (INDEPENDENT_AMBULATORY_CARE_PROVIDER_SITE_OTHER): Payer: Medicare Other | Admitting: Family Medicine

## 2018-12-09 ENCOUNTER — Other Ambulatory Visit: Payer: Self-pay

## 2018-12-09 DIAGNOSIS — E782 Mixed hyperlipidemia: Secondary | ICD-10-CM | POA: Diagnosis not present

## 2018-12-09 DIAGNOSIS — I10 Essential (primary) hypertension: Secondary | ICD-10-CM | POA: Diagnosis not present

## 2018-12-09 DIAGNOSIS — N289 Disorder of kidney and ureter, unspecified: Secondary | ICD-10-CM

## 2018-12-09 DIAGNOSIS — R739 Hyperglycemia, unspecified: Secondary | ICD-10-CM

## 2018-12-09 MED ORDER — HYDROCHLOROTHIAZIDE 25 MG PO TABS: 25.0000 mg | ORAL_TABLET | Freq: Every day | ORAL | 3 refills | Status: DC

## 2018-12-09 NOTE — Progress Notes (Signed)
   Subjective:    Patient ID: Diane Martinez, female    DOB: [birth year], 78 y.o.   MRN: [id number]  HPI Virtual Visit via Telephone Note  I connected with the patient on 12/09/18 at 10:00 AM EDT by telephone and verified that I am speaking with the correct person using two identifiers. We attempted to connect virtually but we had technical difficulties with the audio and video.    I discussed the limitations, risks, security and privacy concerns of performing an evaluation and management service by telephone and the availability of in person appointments. I also discussed with the patient that there may be a patient responsible charge related to this service. The patient expressed understanding and agreed to proceed.  Location patient: home Location provider: work or home office Participants present for the call: patient, provider Patient did not have a visit in the prior 7 days to address this/these issue(s).   History of Present Illness: Here to follow up. She feels great. Her BP has been steady in the 120s over 70s. It was 124/74 this morning. Her left hand is doing much better and she has finished PT. She can use the hand to open jars, etc without pain.    Observations/Objective: Patient sounds cheerful and well on the phone. I do not appreciate any SOB. Speech and thought processing are grossly intact. Patient reported vitals:  Assessment and Plan: Her HTN is stable. She will come by for fasting labs soon to follow her lipids and renal function.  Alysia Penna, MD   Follow Up Instructions:     [phone] [phone] 11-20 [id number] 21-30 I did not refer this patient for an OV in the next 24 hours for this/these issue(s).  I discussed the assessment and treatment plan with the patient. The patient was provided an opportunity to ask questions and all were answered. The patient agreed with the plan and demonstrated an understanding of the instructions.   The patient was advised to call  back or seek an in-person evaluation if the symptoms worsen or if the condition fails to improve as anticipated.  I provided 12 minutes of non-face-to-face time during this encounter.   Alysia Penna, MD    Review of Systems     Objective:   Physical Exam        Assessment & Plan:

## 2018-12-16 ENCOUNTER — Other Ambulatory Visit: Payer: Medicare Other

## 2018-12-23 ENCOUNTER — Other Ambulatory Visit: Payer: Self-pay

## 2018-12-23 ENCOUNTER — Other Ambulatory Visit (INDEPENDENT_AMBULATORY_CARE_PROVIDER_SITE_OTHER): Payer: Medicare Other

## 2018-12-23 DIAGNOSIS — I10 Essential (primary) hypertension: Secondary | ICD-10-CM | POA: Diagnosis not present

## 2018-12-23 DIAGNOSIS — R739 Hyperglycemia, unspecified: Secondary | ICD-10-CM | POA: Diagnosis not present

## 2018-12-23 DIAGNOSIS — R8290 Unspecified abnormal findings in urine: Secondary | ICD-10-CM

## 2018-12-23 LAB — POC URINALSYSI DIPSTICK (AUTOMATED)
Bilirubin, UA: NEGATIVE
Blood, UA: NEGATIVE
Glucose, UA: NEGATIVE
Ketones, UA: NEGATIVE
Nitrite, UA: NEGATIVE
Protein, UA: POSITIVE — AB
Spec Grav, UA: 1.025 (ref 1.010–1.025)
Urobilinogen, UA: NEGATIVE E.U./dL — AB
pH, UA: 6 (ref 5.0–8.0)

## 2018-12-23 LAB — HEMOGLOBIN A1C: Hgb A1c MFr Bld: 6.3 % (ref 4.6–6.5)

## 2018-12-23 LAB — BASIC METABOLIC PANEL
BUN: 28 mg/dL — ABNORMAL HIGH (ref 6–23)
CO2: 29 mEq/L (ref 19–32)
Calcium: 9.8 mg/dL (ref 8.4–10.5)
Chloride: 100 mEq/L (ref 96–112)
Creatinine, Ser: 1.83 mg/dL — ABNORMAL HIGH (ref 0.40–1.20)
GFR: 32.33 mL/min — ABNORMAL LOW (ref >60.00)
Glucose, Bld: 100 mg/dL — ABNORMAL HIGH (ref 70–99)
Potassium: 3.6 mEq/L (ref 3.5–5.1)
Sodium: 140 mEq/L (ref 135–145)

## 2018-12-23 LAB — HEPATIC FUNCTION PANEL
ALT: 17 U/L (ref 0–35)
AST: 15 U/L (ref 0–37)
Albumin: 3.8 g/dL (ref 3.5–5.2)
Alkaline Phosphatase: 133 U/L — ABNORMAL HIGH (ref 39–117)
Bilirubin, Direct: 0.1 mg/dL (ref 0.0–0.3)
Total Bilirubin: 0.5 mg/dL (ref 0.2–1.2)
Total Protein: 7.5 g/dL (ref 6.0–8.3)

## 2018-12-23 LAB — CBC WITH DIFFERENTIAL/PLATELET
Basophils Absolute: 0.1 10*3/uL (ref 0.0–0.1)
Basophils Relative: 0.8 % (ref 0.0–3.0)
Eosinophils Absolute: 0.1 10*3/uL (ref 0.0–0.7)
Eosinophils Relative: 1.5 % (ref 0.0–5.0)
HCT: 42.3 % (ref 36.0–46.0)
Hemoglobin: 14.1 g/dL (ref 12.0–15.0)
Lymphocytes Relative: 26.3 % (ref 12.0–46.0)
Lymphs Abs: 1.9 10*3/uL (ref 0.7–4.0)
MCHC: 33.3 g/dL (ref 30.0–36.0)
MCV: 86.6 fl (ref 78.0–100.0)
Monocytes Absolute: 0.5 10*3/uL (ref 0.1–1.0)
Monocytes Relative: 6.9 % (ref 3.0–12.0)
Neutro Abs: 4.7 10*3/uL (ref 1.4–7.7)
Neutrophils Relative %: 64.5 % (ref 43.0–77.0)
Platelets: 195 10*3/uL (ref 150.0–400.0)
RBC: 4.88 Mil/uL (ref 3.87–5.11)
RDW: 16.6 % — ABNORMAL HIGH (ref 11.5–15.5)
WBC: 7.2 10*3/uL (ref 4.0–10.5)

## 2018-12-23 LAB — LIPID PANEL
Cholesterol: 169 mg/dL (ref 0–200)
HDL: 53.6 mg/dL (ref >39.00)
LDL Cholesterol: 100 mg/dL — ABNORMAL HIGH (ref 0–99)
NonHDL: 115.87
Total CHOL/HDL Ratio: 3
Triglycerides: 79 mg/dL (ref 0.0–149.0)
VLDL: 15.8 mg/dL (ref 0.0–40.0)

## 2018-12-23 LAB — TSH: TSH: 1.28 u[IU]/mL (ref 0.35–4.50)

## 2018-12-25 LAB — URINE CULTURE
MICRO NUMBER:: 510113
SPECIMEN QUALITY:: ADEQUATE

## 2019-01-07 ENCOUNTER — Other Ambulatory Visit: Payer: Self-pay | Admitting: Family Medicine

## 2019-01-07 MED ORDER — CIPROFLOXACIN HCL 500 MG PO TABS: 500.0000 mg | ORAL_TABLET | Freq: Two times a day (BID) | ORAL | 0 refills | Status: DC

## 2019-02-04 IMAGING — MR MR HEAD W/O CM
10 series · 48 of 48 positions shown · non-contrast
Comparison: None.

CLINICAL DATA: Cerebellar ataxia.

EXAM:
MRI HEAD WITHOUT CONTRAST
TECHNIQUE: Multiplanar, multiecho pulse sequences of the brain and surrounding
structures were obtained without intravenous contrast.

[Series 2: T1 · sagittal · 5.0mm · 0.45mm/px · 2 of 21 slices shown]
[im 1/21]
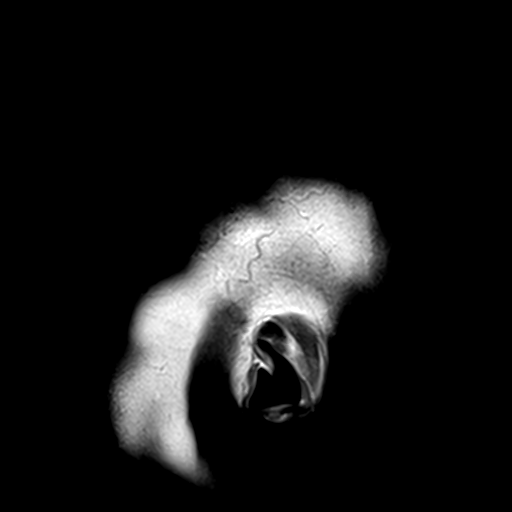
[im 21/21]
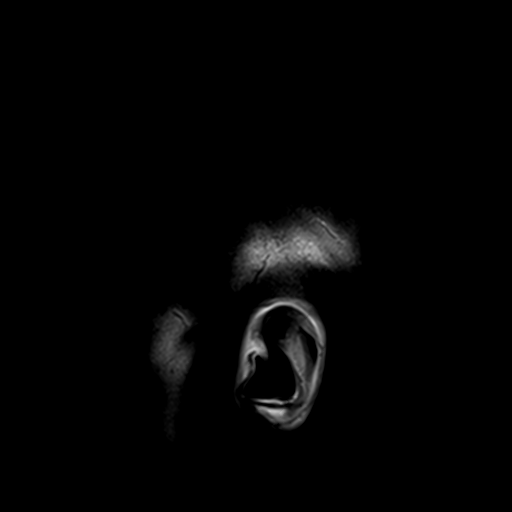

[Series 3: DWI · axial · 3.0mm · 1.80mm/px · z∈[-3,+143]mm · 9 of 100 slices shown (1 of 4)]
[im 1/100]
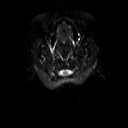
[im 13/100]
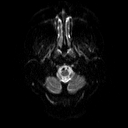
[im 25/100]
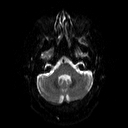
[im 38/100]
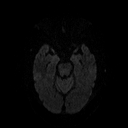
[im 50/100]
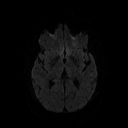
[im 62/100]
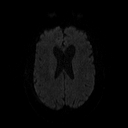
[im 75/100]
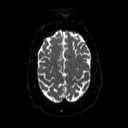
[im 87/100]
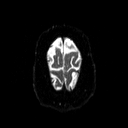
[im 100/100]
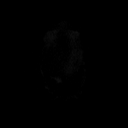

[Series 4: DWI · axial · 3.0mm · 1.80mm/px · z∈[-3,+143]mm · 5 of 50 slices shown (2 of 4)]
[im 1/50]
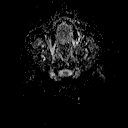
[im 13/50]
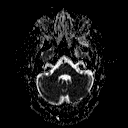
[im 25/50]
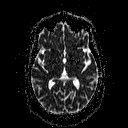
[im 37/50]
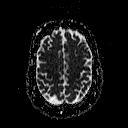
[im 50/50]
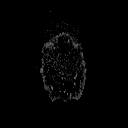

[Series 5: DWI · coronal · 5.0mm · 1.80mm/px · 6 of 67 slices shown (3 of 4)]
[im 1/67]
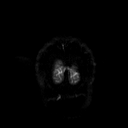
[im 14/67]
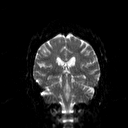
[im 27/67]
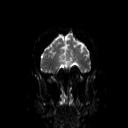
[im 40/67]
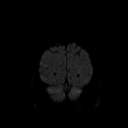
[im 53/67]
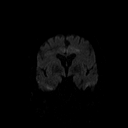
[im 67/67]
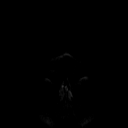

[Series 6: DWI · coronal · 5.0mm · 1.80mm/px · 3 of 34 slices shown (4 of 4)]
[im 1/34]
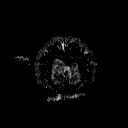
[im 17/34]
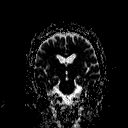
[im 34/34]
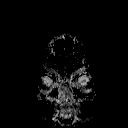

[Series 7: T2 · axial · 5.0mm · 0.51mm/px · z∈[+4,+151]mm · 2 of 22 slices shown (1 of 2)]
[im 1/22]
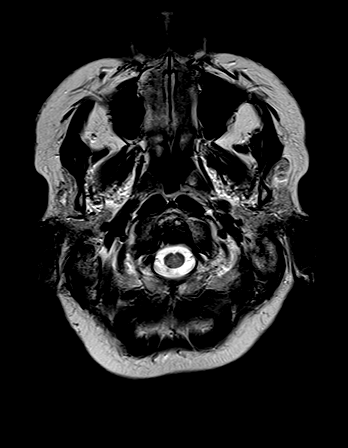
[im 22/22]
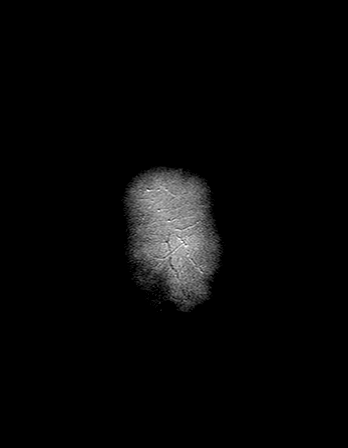

[Series 8: FLAIR · axial · 3.0mm · 0.45mm/px · z∈[+6,+141]mm · 3 of 30 slices shown]
[im 1/30]
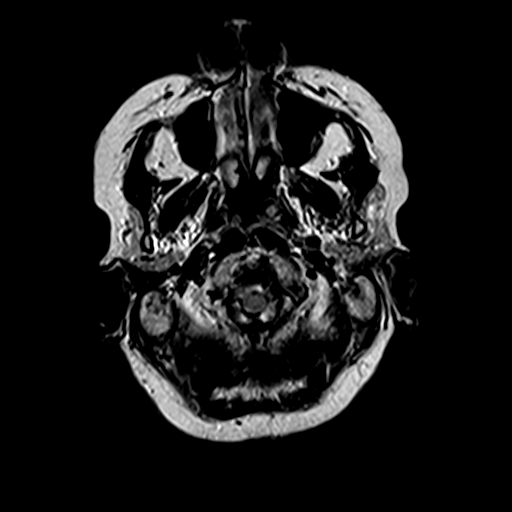
[im 15/30]
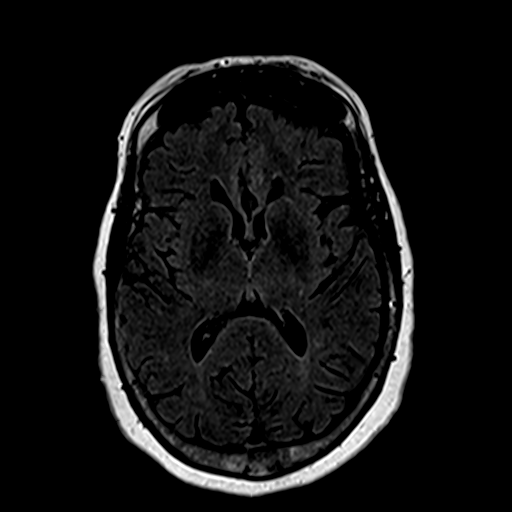
[im 30/30]
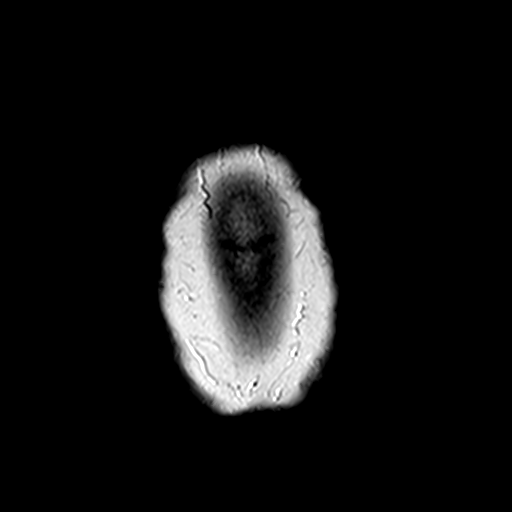

[Series 10: swi_images · axial · 5.0mm · 0.90mm/px · z∈[+4,+148]mm · 3 of 30 slices shown]
[im 1/30]
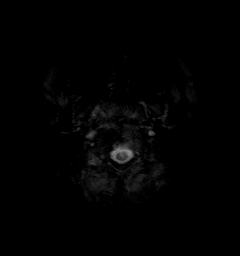
[im 15/30]
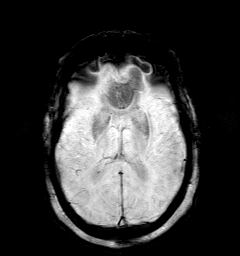
[im 30/30]
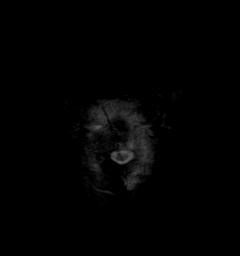

[Series 11: t1_mpr_tra · axial · 1.0mm · 0.71mm/px · z∈[-1,+141]mm · 13 of 144 slices shown]
[im 1/144]
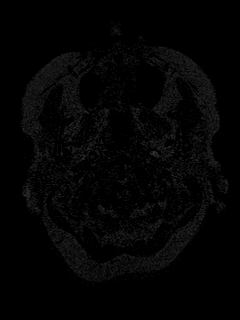
[im 12/144]
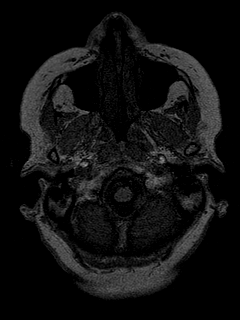
[im 24/144]
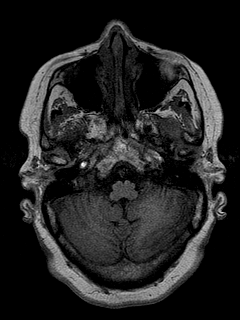
[im 36/144]
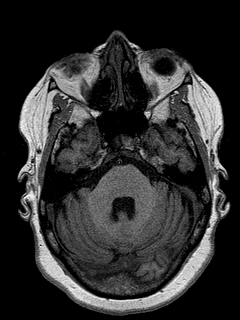
[im 48/144]
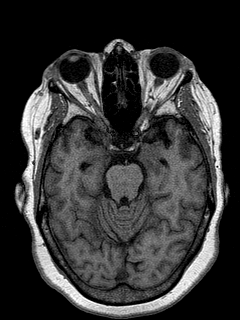
[im 60/144]
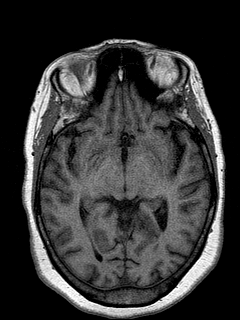
[im 72/144]
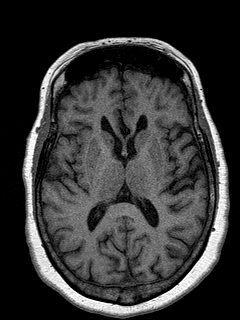
[im 84/144]
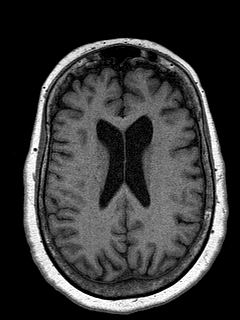
[im 96/144]
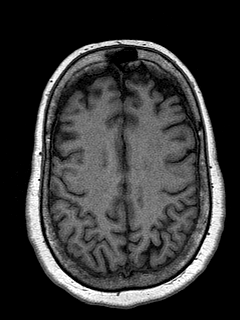
[im 108/144]
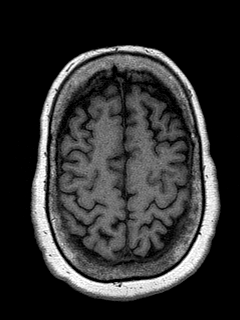
[im 120/144]
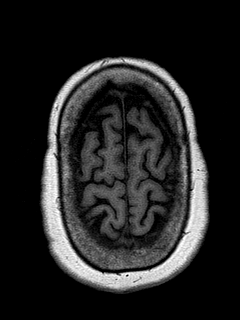
[im 132/144]
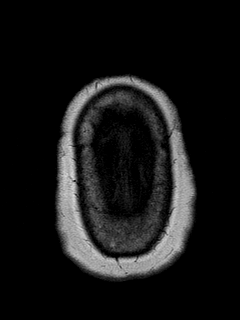
[im 144/144]
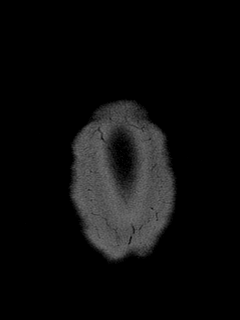

[Series 12: T2 · coronal · 5.0mm · 0.45mm/px · 2 of 27 slices shown (2 of 2)]
[im 1/27]
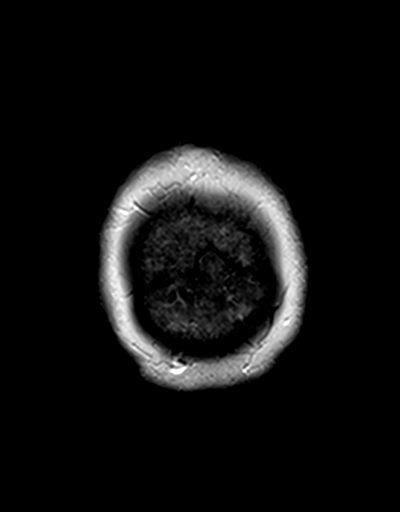
[im 27/27]
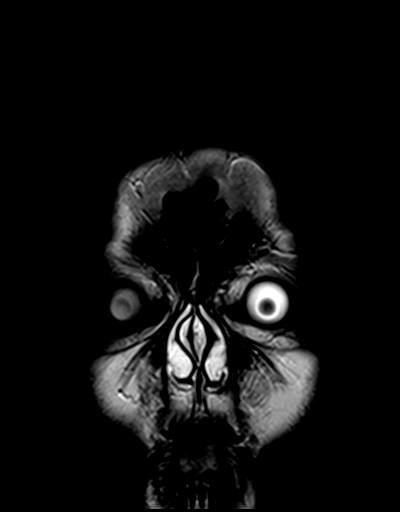

[48 of 48 positions shown; findings below may reference images not displayed]

FINDINGS: Brain: The diffusion-weighted images demonstrate no evidence for
acute or subacute infarction. No acute hemorrhage or mass lesion is
present. Mild atrophy and white matter changes are within normal
limits for age. A remote lacunar infarct is evident in the left
parietal white matter adjacent to the atrium of the left lateral
ventricle. No other focal remote infarct is present. Minimal white
matter changes extend into the brainstem, likely within normal
limits for age. The cerebellum is normal. A relatively empty and
expanded sella is noted.

Vascular: Flow is present in the major intracranial arteries.

Skull and upper cervical spine: The skullbase is within normal
limits.

Sinuses/Orbits: Minimal mucosal thickening is present along the
floor of the maxillary sinuses bilaterally. Paranasal sinuses and
mastoid air cells are otherwise clear. The globes and orbits are
within normal limits.
IMPRESSION: 1. Normal MRI appearance of the brain for age. No acute abnormality.
2. Relatively expanded sella. Statistically, this is unlikely to be
of clinical consequence, but has been reported in the setting of
idiopathic intracranial hypertension. Lumbar puncture may be useful
if the patient's symptoms persist.

## 2019-03-10 ENCOUNTER — Encounter: Payer: Self-pay | Admitting: Family Medicine

## 2019-03-10 ENCOUNTER — Ambulatory Visit (INDEPENDENT_AMBULATORY_CARE_PROVIDER_SITE_OTHER): Payer: Medicare Other | Admitting: Family Medicine

## 2019-03-10 VITALS — BP 150/70 | HR 82 | Temp 97.8°F | Wt 185.6 lb

## 2019-03-10 DIAGNOSIS — N390 Urinary tract infection, site not specified: Secondary | ICD-10-CM | POA: Diagnosis not present

## 2019-03-10 DIAGNOSIS — Z0000 Encounter for general adult medical examination without abnormal findings: Secondary | ICD-10-CM | POA: Diagnosis not present

## 2019-03-10 DIAGNOSIS — Z23 Encounter for immunization: Secondary | ICD-10-CM

## 2019-03-10 LAB — POCT URINALYSIS DIPSTICK
Bilirubin, UA: NEGATIVE
Blood, UA: NEGATIVE
Glucose, UA: NEGATIVE
Ketones, UA: NEGATIVE
Nitrite, UA: NEGATIVE
Protein, UA: POSITIVE — AB
Spec Grav, UA: 1.015 (ref 1.010–1.025)
Urobilinogen, UA: 0.2 E.U./dL
pH, UA: 6 (ref 5.0–8.0)

## 2019-03-10 MED ORDER — ATORVASTATIN CALCIUM 20 MG PO TABS: 20.0000 mg | ORAL_TABLET | Freq: Every day | ORAL | 3 refills | Status: DC

## 2019-03-10 MED ORDER — POTASSIUM CHLORIDE ER 10 MEQ PO TBCR: 10.0000 meq | EXTENDED_RELEASE_TABLET | Freq: Every day | ORAL | 3 refills | Status: DC

## 2019-03-10 NOTE — Progress Notes (Signed)
   Subjective:    Patient ID: Diane Martinez, female    DOB: October 27, 1940, 78 y.o.   MRN: [id number]  HPI Here for a well exam. She feels fine. She had complete lab work here in May and her renal function was stable. Her cholesterol looked good. She did have an asymptomatic UTI with Klebsiella which was treated with Cipro.    Review of Systems  Constitutional: Negative.   HENT: Negative.   Eyes: Negative.   Respiratory: Negative.   Cardiovascular: Negative.   Gastrointestinal: Negative.   Genitourinary: Negative for decreased urine volume, difficulty urinating, dyspareunia, dysuria, enuresis, flank pain, frequency, hematuria, pelvic pain and urgency.  Musculoskeletal: Negative.   Skin: Negative.   Neurological: Negative.   Psychiatric/Behavioral: Negative.        Objective:   Physical Exam Constitutional:      General: She is not in acute distress.    Appearance: She is well-developed.  HENT:     Head: Normocephalic and atraumatic.     Right Ear: External ear normal.     Left Ear: External ear normal.     Nose: Nose normal.     Mouth/Throat:     Pharynx: No oropharyngeal exudate.  Eyes:     General: No scleral icterus.    Conjunctiva/sclera: Conjunctivae normal.     Pupils: Pupils are equal, round, and reactive to light.  Neck:     Musculoskeletal: Normal range of motion and neck supple.     Thyroid: No thyromegaly.     Vascular: No JVD.  Cardiovascular:     Rate and Rhythm: Normal rate.     Heart sounds: Normal heart sounds. No murmur. No friction rub. No gallop.      Comments: She has occasional ectopy  Pulmonary:     Effort: Pulmonary effort is normal. No respiratory distress.     Breath sounds: Normal breath sounds. No wheezing or rales.  Chest:     Chest wall: No tenderness.  Abdominal:     General: Bowel sounds are normal. There is no distension.     Palpations: Abdomen is soft. There is no mass.     Tenderness: There is no abdominal tenderness. There is no  guarding or rebound.  Musculoskeletal: Normal range of motion.        General: No tenderness.  Lymphadenopathy:     Cervical: No cervical adenopathy.  Skin:    General: Skin is warm and dry.     Findings: No erythema or rash.  Neurological:     Mental Status: She is alert and oriented to person, place, and time.     Cranial Nerves: No cranial nerve deficit.     Motor: No abnormal muscle tone.     Coordination: Coordination normal.     Deep Tendon Reflexes: Reflexes are normal and symmetric. Reflexes normal.  Psychiatric:        Behavior: Behavior normal.        Thought Content: Thought content normal.        Judgment: Judgment normal.           Assessment & Plan:  Well exam. We discussed diet and exercise. Given a Prevnar vaccine. She still has bacteria in the urine today so we will get another culture and go from there. She says she drinks lots of water every day.  Alysia Penna, MD

## 2019-03-12 LAB — URINE CULTURE
MICRO NUMBER:: 763997
Result:: NO GROWTH
SPECIMEN QUALITY:: ADEQUATE

## 2019-07-15 ENCOUNTER — Other Ambulatory Visit: Payer: Self-pay | Admitting: Family Medicine

## 2019-09-10 ENCOUNTER — Other Ambulatory Visit: Payer: Self-pay

## 2019-09-13 ENCOUNTER — Other Ambulatory Visit: Payer: Self-pay

## 2019-09-13 ENCOUNTER — Encounter: Payer: Self-pay | Admitting: Family Medicine

## 2019-09-13 ENCOUNTER — Ambulatory Visit: Payer: Medicare PPO | Admitting: Family Medicine

## 2019-09-13 VITALS — BP 140/70 | HR 92 | Temp 97.3°F | Wt 195.8 lb

## 2019-09-13 DIAGNOSIS — E782 Mixed hyperlipidemia: Secondary | ICD-10-CM | POA: Diagnosis not present

## 2019-09-13 DIAGNOSIS — I10 Essential (primary) hypertension: Secondary | ICD-10-CM

## 2019-09-13 DIAGNOSIS — R739 Hyperglycemia, unspecified: Secondary | ICD-10-CM | POA: Diagnosis not present

## 2019-09-13 DIAGNOSIS — N289 Disorder of kidney and ureter, unspecified: Secondary | ICD-10-CM

## 2019-09-13 NOTE — Progress Notes (Signed)
   Subjective:    Patient ID: Diane Martinez, female    DOB: 11/23/1940, 79 y.o.   MRN: [id number]  HPI Here fot follow up. She feels fine. Her BP at home averages 140/80.    Review of Systems  Constitutional: Negative.   Respiratory: Negative.   Cardiovascular: Negative.   Neurological: Negative.        Objective:   Physical Exam Constitutional:      Appearance: Normal appearance.  Cardiovascular:     Rate and Rhythm: Normal rate and regular rhythm.     Pulses: Normal pulses.     Heart sounds: Normal heart sounds.     Comments: Occasional ectopy  Pulmonary:     Effort: Pulmonary effort is normal.     Breath sounds: Normal breath sounds.  Musculoskeletal:     Right lower leg: No edema.     Left lower leg: No edema.  Neurological:     Mental Status: She is alert.           Assessment & Plan:  Her HTN is stable. We will get fasting labs to check lipids, renal function, etc.  Alysia Penna, MD

## 2019-09-16 ENCOUNTER — Other Ambulatory Visit: Payer: Medicare PPO

## 2019-09-22 ENCOUNTER — Other Ambulatory Visit: Payer: Self-pay

## 2019-09-23 ENCOUNTER — Other Ambulatory Visit: Payer: Medicare PPO

## 2019-09-23 LAB — BASIC METABOLIC PANEL
BUN: 30 mg/dL — ABNORMAL HIGH (ref 6–23)
CO2: 33 mEq/L — ABNORMAL HIGH (ref 19–32)
Calcium: 10.1 mg/dL (ref 8.4–10.5)
Chloride: 100 mEq/L (ref 96–112)
Creatinine, Ser: 2.06 mg/dL — ABNORMAL HIGH (ref 0.40–1.20)
GFR: 28.15 mL/min — ABNORMAL LOW (ref >60.00)
Glucose, Bld: 105 mg/dL — ABNORMAL HIGH (ref 70–99)
Potassium: 3.6 mEq/L (ref 3.5–5.1)
Sodium: 141 mEq/L (ref 135–145)

## 2019-09-23 LAB — LIPID PANEL
Cholesterol: 165 mg/dL (ref 0–200)
HDL: 55.8 mg/dL (ref >39.00)
LDL Cholesterol: 91 mg/dL (ref 0–99)
NonHDL: 109.06
Total CHOL/HDL Ratio: 3
Triglycerides: 92 mg/dL (ref 0.0–149.0)
VLDL: 18.4 mg/dL (ref 0.0–40.0)

## 2019-09-23 LAB — HEPATIC FUNCTION PANEL
ALT: 27 U/L (ref 0–35)
AST: 20 U/L (ref 0–37)
Albumin: 3.9 g/dL (ref 3.5–5.2)
Alkaline Phosphatase: 141 U/L — ABNORMAL HIGH (ref 39–117)
Bilirubin, Direct: 0.1 mg/dL (ref 0.0–0.3)
Total Bilirubin: 0.6 mg/dL (ref 0.2–1.2)
Total Protein: 7.8 g/dL (ref 6.0–8.3)

## 2019-09-23 LAB — CBC WITH DIFFERENTIAL/PLATELET
Basophils Absolute: 0 10*3/uL (ref 0.0–0.1)
Basophils Relative: 0.4 % (ref 0.0–3.0)
Eosinophils Absolute: 0.1 10*3/uL (ref 0.0–0.7)
Eosinophils Relative: 1.5 % (ref 0.0–5.0)
HCT: 44 % (ref 36.0–46.0)
Hemoglobin: 14.4 g/dL (ref 12.0–15.0)
Lymphocytes Relative: 24.3 % (ref 12.0–46.0)
Lymphs Abs: 1.6 10*3/uL (ref 0.7–4.0)
MCHC: 32.7 g/dL (ref 30.0–36.0)
MCV: 87.4 fl (ref 78.0–100.0)
Monocytes Absolute: 0.5 10*3/uL (ref 0.1–1.0)
Monocytes Relative: 7.7 % (ref 3.0–12.0)
Neutro Abs: 4.4 10*3/uL (ref 1.4–7.7)
Neutrophils Relative %: 66.1 % (ref 43.0–77.0)
Platelets: 198 10*3/uL (ref 150.0–400.0)
RBC: 5.03 Mil/uL (ref 3.87–5.11)
RDW: 16.1 % — ABNORMAL HIGH (ref 11.5–15.5)
WBC: 6.6 10*3/uL (ref 4.0–10.5)

## 2019-09-23 LAB — URINALYSIS, ROUTINE W REFLEX MICROSCOPIC
Bilirubin Urine: NEGATIVE
Hgb urine dipstick: NEGATIVE
Ketones, ur: NEGATIVE
Nitrite: NEGATIVE
Specific Gravity, Urine: 1.015 (ref 1.000–1.030)
Total Protein, Urine: 30 — AB
Urine Glucose: NEGATIVE
Urobilinogen, UA: 0.2 (ref 0.0–1.0)
pH: 6 (ref 5.0–8.0)

## 2019-09-23 LAB — TSH: TSH: 1.08 u[IU]/mL (ref 0.35–4.50)

## 2019-09-24 LAB — HEMOGLOBIN A1C: Hgb A1c MFr Bld: 5.9 % (ref 4.6–6.5)

## 2019-09-27 ENCOUNTER — Other Ambulatory Visit: Payer: Self-pay

## 2019-09-27 MED ORDER — CIPROFLOXACIN HCL 500 MG PO TABS: 500.0000 mg | ORAL_TABLET | Freq: Two times a day (BID) | ORAL | 0 refills | Status: DC

## 2019-10-22 ENCOUNTER — Other Ambulatory Visit: Payer: Self-pay | Admitting: *Deleted

## 2019-10-22 ENCOUNTER — Telehealth: Payer: Self-pay | Admitting: Family Medicine

## 2019-10-22 DIAGNOSIS — N1832 Chronic kidney disease, stage 3b: Secondary | ICD-10-CM

## 2019-10-22 DIAGNOSIS — N289 Disorder of kidney and ureter, unspecified: Secondary | ICD-10-CM

## 2019-10-22 NOTE — Telephone Encounter (Signed)
Referral placed already since she is a patient of his already. Is this okay?

## 2019-10-22 NOTE — Telephone Encounter (Signed)
The referral was done  

## 2019-10-22 NOTE — Telephone Encounter (Signed)
Patient called would like a referral to [facility] to see Dr. Moshe Cipro. Patient stated she have seeing this doctor already but needs a referral.

## 2019-12-02 DIAGNOSIS — Z6835 Body mass index (BMI) 35.0-35.9, adult: Secondary | ICD-10-CM | POA: Diagnosis not present

## 2019-12-02 DIAGNOSIS — I129 Hypertensive chronic kidney disease with stage 1 through stage 4 chronic kidney disease, or unspecified chronic kidney disease: Secondary | ICD-10-CM | POA: Diagnosis not present

## 2019-12-02 DIAGNOSIS — N183 Chronic kidney disease, stage 3 unspecified: Secondary | ICD-10-CM | POA: Diagnosis not present

## 2020-02-03 ENCOUNTER — Other Ambulatory Visit: Payer: Self-pay | Admitting: Family Medicine

## 2020-03-12 ENCOUNTER — Other Ambulatory Visit: Payer: Self-pay | Admitting: Family Medicine

## 2020-03-13 ENCOUNTER — Ambulatory Visit: Payer: Medicare PPO | Admitting: Family Medicine

## 2020-03-13 ENCOUNTER — Encounter: Payer: Self-pay | Admitting: Family Medicine

## 2020-03-13 ENCOUNTER — Other Ambulatory Visit: Payer: Self-pay

## 2020-03-13 VITALS — BP 120/66 | HR 74 | Temp 98.4°F | Wt 190.2 lb

## 2020-03-13 DIAGNOSIS — N289 Disorder of kidney and ureter, unspecified: Secondary | ICD-10-CM | POA: Diagnosis not present

## 2020-03-13 DIAGNOSIS — I10 Essential (primary) hypertension: Secondary | ICD-10-CM | POA: Diagnosis not present

## 2020-03-13 MED ORDER — POTASSIUM CHLORIDE ER 10 MEQ PO TBCR: 10.0000 meq | EXTENDED_RELEASE_TABLET | Freq: Every day | ORAL | 3 refills | Status: DC

## 2020-03-13 MED ORDER — ATORVASTATIN CALCIUM 20 MG PO TABS: 20.0000 mg | ORAL_TABLET | Freq: Every day | ORAL | 3 refills | Status: DC

## 2020-03-13 NOTE — Progress Notes (Signed)
   Subjective:    Patient ID: Diane Martinez, female    DOB: 1940/10/11, 79 y.o.   MRN: [id number]  HPI Here to follow up. She feels well and her BP is stable. She saw Dr. Moshe Cipro in March, and she said her kidneys were doing fine.    Review of Systems  Constitutional: Negative.   Respiratory: Negative.   Cardiovascular: Negative.        Objective:   Physical Exam Constitutional:      Appearance: Normal appearance.  Cardiovascular:     Rate and Rhythm: Normal rate and regular rhythm.     Pulses: Normal pulses.     Heart sounds: Normal heart sounds.  Pulmonary:     Effort: Pulmonary effort is normal.     Breath sounds: Normal breath sounds.  Musculoskeletal:     Right lower leg: No edema.     Left lower leg: No edema.  Neurological:     Mental Status: She is alert.           Assessment & Plan:  Her HTN is stable. Her renal status is stable. Recheck in 6 months with a well exam.  Alysia Penna, MD

## 2020-06-14 DIAGNOSIS — N183 Chronic kidney disease, stage 3 unspecified: Secondary | ICD-10-CM | POA: Diagnosis not present

## 2020-06-14 DIAGNOSIS — I129 Hypertensive chronic kidney disease with stage 1 through stage 4 chronic kidney disease, or unspecified chronic kidney disease: Secondary | ICD-10-CM | POA: Diagnosis not present

## 2020-06-14 DIAGNOSIS — Z6835 Body mass index (BMI) 35.0-35.9, adult: Secondary | ICD-10-CM | POA: Diagnosis not present

## 2020-06-15 DIAGNOSIS — N390 Urinary tract infection, site not specified: Secondary | ICD-10-CM | POA: Diagnosis not present

## 2020-07-16 ENCOUNTER — Other Ambulatory Visit: Payer: Self-pay | Admitting: Family Medicine

## 2020-07-26 ENCOUNTER — Telehealth: Payer: Self-pay | Admitting: Family Medicine

## 2020-07-26 NOTE — Telephone Encounter (Signed)
Left message for patient to call back and schedule Medicare Annual Wellness Visit (AWV) either virtually or in office.   Last AWV no information please schedule at anytime with [facility] Nurse Health Advisor 1 or 2   This should be a 45 minute visit. 

## 2020-08-11 ENCOUNTER — Ambulatory Visit (INDEPENDENT_AMBULATORY_CARE_PROVIDER_SITE_OTHER): Payer: Medicare PPO

## 2020-08-11 ENCOUNTER — Other Ambulatory Visit: Payer: Self-pay

## 2020-08-11 DIAGNOSIS — Z780 Asymptomatic menopausal state: Secondary | ICD-10-CM

## 2020-08-11 DIAGNOSIS — Z0000 Encounter for general adult medical examination without abnormal findings: Secondary | ICD-10-CM

## 2020-08-11 NOTE — Progress Notes (Signed)
Subjective:   Diane Martinez is a 80 y.o. female who presents for an Initial Medicare Annual Wellness Visit.  I connected with Janisse Ghan today by telephone and verified that I am speaking with the correct person using two identifiers. Location patient: home Location provider: work Persons participating in the virtual visit: patient, provider.   I discussed the limitations, risks, security and privacy concerns of performing an evaluation and management service by telephone and the availability of in person appointments. I also discussed with the patient that there may be a patient responsible charge related to this service. The patient expressed understanding and verbally consented to this telephonic visit.    Interactive audio and video telecommunications were attempted between this provider and patient, however failed, due to patient having technical difficulties OR patient did not have access to video capability.  We continued and completed visit with audio only.      Review of Systems    N/A  Cardiac Risk Factors include: advanced age (>64men, >59 women);hypertension     Objective:    Today's Vitals   There is no height or weight on file to calculate BMI.  Advanced Directives 08/11/2020  Does Patient Have a Medical Advance Directive? Yes  Type of Advance Directive Living will;Healthcare Power of Attorney  Does patient want to make changes to medical advance directive? No - Patient declined  Copy of [place] in Chart? No - copy requested    Current Medications (verified) Outpatient Encounter Medications as of 08/11/2020  Medication Sig  . aspirin 81 MG tablet Take 1 tablet (81 mg total) by mouth daily.  Marland Kitchen atorvastatin (LIPITOR) 20 MG tablet Take 1 tablet (20 mg total) by mouth daily.  . Calcium Carbonate-Vitamin D 600-400 MG-UNIT per tablet Take 1 tablet by mouth daily. 1200 mg  . COD LIVER OIL PO Take by mouth daily.  . Cyanocobalamin (B-12) 2500 MCG  SUBL Place 1,000 mcg under the tongue daily.  . fish oil-omega-3 fatty acids 1000 MG capsule Take 1 g by mouth daily. 1200 mg  . hydrALAZINE (APRESOLINE) 50 MG tablet TAKE 1 TABLET BY MOUTH TWICE A DAY  . hydrochlorothiazide (HYDRODIURIL) 25 MG tablet TAKE 1 TABLET BY MOUTH EVERY DAY  . Multiple Vitamins-Minerals (PRESERVISION AREDS PO) Take by mouth daily.  . potassium chloride (KLOR-CON) 10 MEQ tablet Take 1 tablet (10 mEq total) by mouth daily.  . Red Yeast Rice Extract (RED YEAST RICE PO) Take 600 mg by mouth 2 (two) times daily.  . [DISCONTINUED] ciprofloxacin (CIPRO) 500 MG tablet Take 1 tablet (500 mg total) by mouth 2 (two) times daily.   No facility-administered encounter medications on file as of 08/11/2020.    Allergies (verified) Patient has no known allergies.   History: Past Medical History:  Diagnosis Date  . Allergy    sees Dr. Caprice Red   . Chronic kidney disease    sees Dr. Corliss Parish   . Hypertension    Past Surgical History:  Procedure Laterality Date  . ABDOMINAL HYSTERECTOMY    . COLONOSCOPY  11-19-11   per Dr. Sharlett Iles, benign polyp, repeat in 10 yrs   . TONSILLECTOMY     Family History  Problem Relation Age of Onset  . Heart disease Mother   . Heart disease Father   . Cancer Brother        prostate  . Prostate cancer Brother   . Diabetes Brother   . Diabetes Sister   . Diabetes Brother   .  Colon cancer Neg Hx   . Esophageal cancer Neg Hx   . Rectal cancer Neg Hx   . Stomach cancer Neg Hx    Social History   Socioeconomic History  . Marital status: Married    Spouse name: Not on file  . Number of children: Not on file  . Years of education: Not on file  . Highest education level: Not on file  Occupational History  . Not on file  Tobacco Use  . Smoking status: Former Smoker    Types: Cigarettes    Quit date: 11/04/1968    Years since quitting: 51.8  . Smokeless tobacco: Never Used  Vaping Use  . Vaping Use: Never used   Substance and Sexual Activity  . Alcohol use: No    Alcohol/week: 0.0 standard drinks  . Drug use: No  . Sexual activity: Never  Other Topics Concern  . Not on file  Social History Narrative  . Not on file   Social Determinants of Health   Financial Resource Strain: Low Risk   . Difficulty of Paying Living Expenses: Not hard at all  Food Insecurity: No Food Insecurity  . Worried About Charity fundraiser in the Last Year: Never true  . Ran Out of Food in the Last Year: Never true  Transportation Needs: No Transportation Needs  . Lack of Transportation (Medical): No  . Lack of Transportation (Non-Medical): No  Physical Activity: Insufficiently Active  . Days of Exercise per Week: 5 days  . Minutes of Exercise per Session: 20 min  Stress: No Stress Concern Present  . Feeling of Stress : Not at all  Social Connections: Moderately Integrated  . Frequency of Communication with Friends and Family: More than three times a week  . Frequency of Social Gatherings with Friends and Family: More than three times a week  . Attends Religious Services: More than 4 times per year  . Active Member of Clubs or Organizations: Yes  . Attends Archivist Meetings: Never  . Marital Status: Never married    Tobacco Counseling Counseling given: Not Answered   Clinical Intake:  Pre-visit preparation completed: Yes  Pain : No/denies pain     Nutritional Risks: None Diabetes: No  How often do you need to have someone help you when you read instructions, pamphlets, or other written materials from your doctor or pharmacy?: 1 - Never What is the last grade level you completed in school?: Masters Degree  Diabetic?No  Interpreter Needed?: No  Information entered by :: [facility] Daily Living In your present state of health, do you have any difficulty performing the following activities: 08/11/2020  Hearing? N  Vision? N  Difficulty concentrating or making  decisions? N  Walking or climbing stairs? N  Dressing or bathing? N  Doing errands, shopping? N  Preparing Food and eating ? N  Using the Toilet? N  In the past six months, have you accidently leaked urine? N  Do you have problems with loss of bowel control? N  Managing your Medications? N  Managing your Finances? N  Housekeeping or managing your Housekeeping? N  Some recent data might be hidden    Patient Care Team: Laurey Morale, MD as PCP - General  Indicate any recent Medical Services you may have received from other than [facility] providers in the past year (date may be approximate).     Assessment:   This is a routine wellness examination for [facility].  Hearing/Vision screen  Hearing Screening   125Hz  250Hz  500Hz  1000Hz  2000Hz  3000Hz  4000Hz  6000Hz  8000Hz   Right ear:           Left ear:           Vision Screening Comments: Patient states has not had eye exam in over year. Usually see the every year   Dietary issues and exercise activities discussed: Current Exercise Habits: Home exercise routine, Type of exercise: walking, Time (Minutes): 15, Frequency (Times/Week): 4, Weekly Exercise (Minutes/Week): 60, Intensity: Mild, Exercise limited by: None identified  Goals    . Exercise 150 min/wk Moderate Activity      Depression Screen PHQ 2/9 Scores 08/11/2020 10/15/2017 10/09/2016 10/11/2015 10/04/2014  PHQ - 2 Score 0 0 0 0 0  PHQ- 9 Score 0 - - - -    Fall Risk Fall Risk  08/11/2020 10/15/2017 10/09/2016 10/11/2015 10/04/2014  Falls in the past year? 0 No No No No  Number falls in past yr: 0 - - - -  Injury with Fall? 0 - - - -  Risk for fall due to : No Fall Risks - - - -  Follow up Falls evaluation completed;Falls prevention discussed - - - -    FALL RISK PREVENTION PERTAINING TO THE HOME:  Any stairs in or around the home? No  If so, are there any without handrails? No  Home free of loose throw rugs in walkways, pet beds, electrical cords, etc? Yes  Adequate lighting in your  home to reduce risk of falls? Yes   ASSISTIVE DEVICES UTILIZED TO PREVENT FALLS:  Life alert? No  Use of a cane, walker or w/c? No  Grab bars in the bathroom? No  Shower chair or bench in shower? No  Elevated toilet seat or a handicapped toilet? No    Cognitive Function:   Normal cognitive status assessed by direct observation by this Nurse Health Advisor. No abnormalities found. ;      Immunizations Immunization History  Administered Date(s) Administered  . PFIZER SARS-COV-2 Vaccination 10/23/2019, 11/13/2019, 07/02/2020  . Pneumococcal Conjugate-13 03/10/2019  . Td 01/26/2018    TDAP status: Up to date  Flu Vaccine status: Due, Education has been provided regarding the importance of this vaccine. Advised may receive this vaccine at local pharmacy or Health Dept. Aware to provide a copy of the vaccination record if obtained from local pharmacy or Health Dept. Verbalized acceptance and understanding.  Pneumococcal vaccine status: Due, Education has been provided regarding the importance of this vaccine. Advised may receive this vaccine at local pharmacy or Health Dept. Aware to provide a copy of the vaccination record if obtained from local pharmacy or Health Dept. Verbalized acceptance and understanding.  Covid-19 vaccine status: Completed vaccines  Qualifies for Shingles Vaccine? Yes   Zostavax completed No   Shingrix Completed?: No.    Education has been provided regarding the importance of this vaccine. Patient has been advised to call insurance company to determine out of pocket expense if they have not yet received this vaccine. Advised may also receive vaccine at local pharmacy or Health Dept. Verbalized acceptance and understanding.  Screening Tests Health Maintenance  Topic Date Due  . Hepatitis C Screening  Never done  . DEXA SCAN  Never done  . INFLUENZA VACCINE  Never done  . PNA vac Low Risk Adult (2 of 2 - PPSV23) 03/09/2020  . TETANUS/TDAP  01/27/2028  .  COVID-19 Vaccine  Completed    Health Maintenance  Health Maintenance  Due  Topic Date Due  . Hepatitis C Screening  Never done  . DEXA SCAN  Never done  . INFLUENZA VACCINE  Never done  . PNA vac Low Risk Adult (2 of 2 - PPSV23) 03/09/2020    Colorectal cancer screening: No longer required.   Mammogram status: No longer required due to age.  Bone Density status: Ordered 08/11/2020. Pt provided with contact info and advised to call to schedule appt.  Lung Cancer Screening: (Low Dose CT Chest recommended if Age 71-80 years, 30 pack-year currently smoking OR have quit w/in 15years.) does not qualify.   Lung Cancer Screening Referral: N/A   Additional Screening:  Hepatitis C Screening: does qualify;  Vision Screening: Recommended annual ophthalmology exams for early detection of glaucoma and other disorders of the eye. Is the patient up to date with their annual eye exam?  No  Who is the provider or what is the name of the office in which the patient attends annual eye exams? Dr.Groat  If pt is not established with a provider, would they like to be referred to a provider to establish care? No .   Dental Screening: Recommended annual dental exams for proper oral hygiene  Community Resource Referral / Chronic Care Management: CRR required this visit?  No   CCM required this visit?  No      Plan:     I have personally reviewed and noted the following in the patient's chart:   . Medical and social history . Use of alcohol, tobacco or illicit drugs  . Current medications and supplements . Functional ability and status . Nutritional status . Physical activity . Advanced directives . List of other physicians . Hospitalizations, surgeries, and ER visits in previous 12 months . Vitals . Screenings to include cognitive, depression, and falls . Referrals and appointments  In addition, I have reviewed and discussed with patient certain preventive protocols, quality metrics,  and best practice recommendations. A written personalized care plan for preventive services as well as general preventive health recommendations were provided to patient.     Ofilia Neas, LPN   12/23/7822   Nurse Notes: None

## 2020-08-11 NOTE — Patient Instructions (Signed)
[name] , Thank you for taking time to come for your Medicare Wellness Visit. I appreciate your ongoing commitment to your health goals. Please review the following plan we discussed and let me know if I can assist you in the future.   Screening recommendations/referrals: Colonoscopy: No longer required  Mammogram: No longer required  Bone Density: Currently due, orders placed this visit  Recommended yearly ophthalmology/optometry visit for glaucoma screening and checkup Recommended yearly dental visit for hygiene and checkup  Vaccinations: Influenza vaccine: Patient declined  Pneumococcal vaccine: Currently due for Pneumovax 23 you may receive at your next in person office visit  Tdap vaccine: Up to date, next due 01/2028 Shingles vaccine: Currently due for Shingrix, if you wish to receive we recommend that you do so at your local pharmacy.     Advanced directives: Please bring in copies of your advanced directives into our office so that we may scan them into your chart  Conditions/risks identified: None   Next appointment: 09/13/2020 @ 9:00 am with Dr. Sarajane Jews    Preventive Care 65 Years and Older, Female Preventive care refers to lifestyle choices and visits with your health care provider that can promote health and wellness. What does preventive care include?  A yearly physical exam. This is also called an annual well check.  Dental exams once or twice a year.  Routine eye exams. Ask your health care provider how often you should have your eyes checked.  Personal lifestyle choices, including:  Daily care of your teeth and gums.  Regular physical activity.  Eating a healthy diet.  Avoiding tobacco and drug use.  Limiting alcohol use.  Practicing safe sex.  Taking low-dose aspirin every day.  Taking vitamin and mineral supplements as recommended by your health care provider. What happens during an annual well check? The services and screenings done by your health  care provider during your annual well check will depend on your age, overall health, lifestyle risk factors, and family history of disease. Counseling  Your health care provider may ask you questions about your:  Alcohol use.  Tobacco use.  Drug use.  Emotional well-being.  Home and relationship well-being.  Sexual activity.  Eating habits.  History of falls.  Memory and ability to understand (cognition).  Work and work Statistician.  Reproductive health. Screening  You may have the following tests or measurements:  Height, weight, and BMI.  Blood pressure.  Lipid and cholesterol levels. These may be checked every 5 years, or more frequently if you are over 39 years old.  Skin check.  Lung cancer screening. You may have this screening every year starting at age 21 if you have a 30-pack-year history of smoking and currently smoke or have quit within the past 15 years.  Fecal occult blood test (FOBT) of the stool. You may have this test every year starting at age 80.  Flexible sigmoidoscopy or colonoscopy. You may have a sigmoidoscopy every 5 years or a colonoscopy every 10 years starting at age 55.  Hepatitis C blood test.  Hepatitis B blood test.  Sexually transmitted disease (STD) testing.  Diabetes screening. This is done by checking your blood sugar (glucose) after you have not eaten for a while (fasting). You may have this done every 1-3 years.  Bone density scan. This is done to screen for osteoporosis. You may have this done starting at age 87.  Mammogram. This may be done every 1-2 years. Talk to your health care provider about how often  you should have regular mammograms. Talk with your health care provider about your test results, treatment options, and if necessary, the need for more tests. Vaccines  Your health care provider may recommend certain vaccines, such as:  Influenza vaccine. This is recommended every year.  Tetanus, diphtheria, and  acellular pertussis (Tdap, Td) vaccine. You may need a Td booster every 10 years.  Zoster vaccine. You may need this after age 81.  Pneumococcal 13-valent conjugate (PCV13) vaccine. One dose is recommended after age 37.  Pneumococcal polysaccharide (PPSV23) vaccine. One dose is recommended after age 49. Talk to your health care provider about which screenings and vaccines you need and how often you need them. This information is not intended to replace advice given to you by your health care provider. Make sure you discuss any questions you have with your health care provider. Document Released: 08/11/2015 Document Revised: 04/03/2016 Document Reviewed: 05/16/2015 Elsevier Interactive Patient Education  2017 [employer] Prevention in the Home Falls can cause injuries. They can happen to people of all ages. There are many things you can do to make your home safe and to help prevent falls. What can I do on the outside of my home?  Regularly fix the edges of walkways and driveways and fix any cracks.  Remove anything that might make you trip as you walk through a door, such as a raised step or threshold.  Trim any bushes or trees on the path to your home.  Use bright outdoor lighting.  Clear any walking paths of anything that might make someone trip, such as rocks or tools.  Regularly check to see if handrails are loose or broken. Make sure that both sides of any steps have handrails.  Any raised decks and porches should have guardrails on the edges.  Have any leaves, snow, or ice cleared regularly.  Use sand or salt on walking paths during winter.  Clean up any spills in your garage right away. This includes oil or grease spills. What can I do in the bathroom?  Use night lights.  Install grab bars by the toilet and in the tub and shower. Do not use towel bars as grab bars.  Use non-skid mats or decals in the tub or shower.  If you need to sit down in the shower, use  a plastic, non-slip stool.  Keep the floor dry. Clean up any water that spills on the floor as soon as it happens.  Remove soap buildup in the tub or shower regularly.  Attach bath mats securely with double-sided non-slip rug tape.  Do not have throw rugs and other things on the floor that can make you trip. What can I do in the bedroom?  Use night lights.  Make sure that you have a light by your bed that is easy to reach.  Do not use any sheets or blankets that are too big for your bed. They should not hang down onto the floor.  Have a firm chair that has side arms. You can use this for support while you get dressed.  Do not have throw rugs and other things on the floor that can make you trip. What can I do in the kitchen?  Clean up any spills right away.  Avoid walking on wet floors.  Keep items that you use a lot in easy-to-reach places.  If you need to reach something above you, use a strong step stool that has a grab bar.  Keep electrical cords  out of the way.  Do not use floor polish or wax that makes floors slippery. If you must use wax, use non-skid floor wax.  Do not have throw rugs and other things on the floor that can make you trip. What can I do with my stairs?  Do not leave any items on the stairs.  Make sure that there are handrails on both sides of the stairs and use them. Fix handrails that are broken or loose. Make sure that handrails are as long as the stairways.  Check any carpeting to make sure that it is firmly attached to the stairs. Fix any carpet that is loose or worn.  Avoid having throw rugs at the top or bottom of the stairs. If you do have throw rugs, attach them to the floor with carpet tape.  Make sure that you have a light switch at the top of the stairs and the bottom of the stairs. If you do not have them, ask someone to add them for you. What else can I do to help prevent falls?  Wear shoes that:  Do not have high heels.  Have  rubber bottoms.  Are comfortable and fit you well.  Are closed at the toe. Do not wear sandals.  If you use a stepladder:  Make sure that it is fully opened. Do not climb a closed stepladder.  Make sure that both sides of the stepladder are locked into place.  Ask someone to hold it for you, if possible.  Clearly mark and make sure that you can see:  Any grab bars or handrails.  First and last steps.  Where the edge of each step is.  Use tools that help you move around (mobility aids) if they are needed. These include:  Canes.  Walkers.  Scooters.  Crutches.  Turn on the lights when you go into a dark area. Replace any light bulbs as soon as they burn out.  Set up your furniture so you have a clear path. Avoid moving your furniture around.  If any of your floors are uneven, fix them.  If there are any pets around you, be aware of where they are.  Review your medicines with your doctor. Some medicines can make you feel dizzy. This can increase your chance of falling. Ask your doctor what other things that you can do to help prevent falls. This information is not intended to replace advice given to you by your health care provider. Make sure you discuss any questions you have with your health care provider. Document Released: 05/11/2009 Document Revised: 12/21/2015 Document Reviewed: 08/19/2014 Elsevier Interactive Patient Education  2017 [employer].

## 2020-08-25 ENCOUNTER — Other Ambulatory Visit: Payer: Self-pay | Admitting: Family Medicine

## 2020-08-25 DIAGNOSIS — Z780 Asymptomatic menopausal state: Secondary | ICD-10-CM

## 2020-09-13 ENCOUNTER — Ambulatory Visit: Payer: Medicare PPO | Admitting: Family Medicine

## 2020-09-15 ENCOUNTER — Other Ambulatory Visit: Payer: Self-pay

## 2020-09-18 ENCOUNTER — Ambulatory Visit: Payer: Medicare PPO | Admitting: Family Medicine

## 2020-09-18 ENCOUNTER — Other Ambulatory Visit: Payer: Self-pay

## 2020-09-18 ENCOUNTER — Encounter: Payer: Self-pay | Admitting: Family Medicine

## 2020-09-18 VITALS — BP 132/76 | HR 57 | Temp 98.1°F | Ht 68.0 in | Wt 189.6 lb

## 2020-09-18 DIAGNOSIS — N289 Disorder of kidney and ureter, unspecified: Secondary | ICD-10-CM

## 2020-09-18 DIAGNOSIS — I10 Essential (primary) hypertension: Secondary | ICD-10-CM | POA: Diagnosis not present

## 2020-09-18 DIAGNOSIS — E782 Mixed hyperlipidemia: Secondary | ICD-10-CM

## 2020-09-18 DIAGNOSIS — R739 Hyperglycemia, unspecified: Secondary | ICD-10-CM

## 2020-09-18 DIAGNOSIS — Z23 Encounter for immunization: Secondary | ICD-10-CM

## 2020-09-18 LAB — HEPATIC FUNCTION PANEL
ALT: 20 U/L (ref 0–35)
AST: 18 U/L (ref 0–37)
Albumin: 3.9 g/dL (ref 3.5–5.2)
Alkaline Phosphatase: 129 U/L — ABNORMAL HIGH (ref 39–117)
Bilirubin, Direct: 0.1 mg/dL (ref 0.0–0.3)
Total Bilirubin: 0.6 mg/dL (ref 0.2–1.2)
Total Protein: 7.6 g/dL (ref 6.0–8.3)

## 2020-09-18 LAB — CBC WITH DIFFERENTIAL/PLATELET
Basophils Absolute: 0 10*3/uL (ref 0.0–0.1)
Basophils Relative: 0.7 % (ref 0.0–3.0)
Eosinophils Absolute: 0.1 10*3/uL (ref 0.0–0.7)
Eosinophils Relative: 1.8 % (ref 0.0–5.0)
HCT: 44.3 % (ref 36.0–46.0)
Hemoglobin: 14.4 g/dL (ref 12.0–15.0)
Lymphocytes Relative: 19.2 % (ref 12.0–46.0)
Lymphs Abs: 1 10*3/uL (ref 0.7–4.0)
MCHC: 32.6 g/dL (ref 30.0–36.0)
MCV: 86.5 fl (ref 78.0–100.0)
Monocytes Absolute: 0.5 10*3/uL (ref 0.1–1.0)
Monocytes Relative: 8.7 % (ref 3.0–12.0)
Neutro Abs: 3.7 10*3/uL (ref 1.4–7.7)
Neutrophils Relative %: 69.6 % (ref 43.0–77.0)
Platelets: 164 10*3/uL (ref 150.0–400.0)
RBC: 5.12 Mil/uL — ABNORMAL HIGH (ref 3.87–5.11)
RDW: 15.6 % — ABNORMAL HIGH (ref 11.5–15.5)
WBC: 5.3 10*3/uL (ref 4.0–10.5)

## 2020-09-18 LAB — BASIC METABOLIC PANEL
BUN: 36 mg/dL — ABNORMAL HIGH (ref 6–23)
CO2: 28 mEq/L (ref 19–32)
Calcium: 9.9 mg/dL (ref 8.4–10.5)
Chloride: 97 mEq/L (ref 96–112)
Creatinine, Ser: 1.88 mg/dL — ABNORMAL HIGH (ref 0.40–1.20)
GFR: 25.14 mL/min — ABNORMAL LOW (ref >60.00)
Glucose, Bld: 89 mg/dL (ref 70–99)
Potassium: 3.4 mEq/L — ABNORMAL LOW (ref 3.5–5.1)
Sodium: 138 mEq/L (ref 135–145)

## 2020-09-18 LAB — HEMOGLOBIN A1C: Hgb A1c MFr Bld: 5.8 % (ref 4.6–6.5)

## 2020-09-18 LAB — LIPID PANEL
Cholesterol: 170 mg/dL (ref 0–200)
HDL: 63.9 mg/dL (ref >39.00)
LDL Cholesterol: 93 mg/dL (ref 0–99)
NonHDL: 106.24
Total CHOL/HDL Ratio: 3
Triglycerides: 68 mg/dL (ref 0.0–149.0)
VLDL: 13.6 mg/dL (ref 0.0–40.0)

## 2020-09-18 LAB — TSH: TSH: 1.4 u[IU]/mL (ref 0.35–4.50)

## 2020-09-18 NOTE — Progress Notes (Signed)
   Subjective:    Patient ID: Diane Martinez, female    DOB: 1940-09-28, 80 y.o.   MRN: [id number]  HPI Here to follow up. She feels fine and her BP is stable.    Review of Systems  Constitutional: Negative.   Respiratory: Negative.   Cardiovascular: Negative.   Neurological: Negative.        Objective:   Physical Exam Constitutional:      Appearance: Normal appearance.  Cardiovascular:     Rate and Rhythm: Normal rate and regular rhythm.     Pulses: Normal pulses.     Heart sounds: Normal heart sounds.  Pulmonary:     Effort: Pulmonary effort is normal.     Breath sounds: Normal breath sounds.  Musculoskeletal:     Right lower leg: No edema.     Left lower leg: No edema.  Neurological:     Mental Status: She is alert.           Assessment & Plan:  She is doing well. Her HTN is stable. Get fasting labs for lipids, A1c, etc.  Alysia Penna, MD

## 2020-09-19 ENCOUNTER — Other Ambulatory Visit: Payer: Self-pay

## 2020-09-19 MED ORDER — POTASSIUM CHLORIDE CRYS ER 10 MEQ PO TBCR: 10.0000 meq | EXTENDED_RELEASE_TABLET | Freq: Two times a day (BID) | ORAL | 3 refills | Status: DC

## 2021-01-31 ENCOUNTER — Other Ambulatory Visit: Payer: Self-pay | Admitting: Family Medicine

## 2021-03-19 ENCOUNTER — Encounter: Payer: Self-pay | Admitting: Family Medicine

## 2021-03-19 ENCOUNTER — Other Ambulatory Visit: Payer: Self-pay

## 2021-03-19 ENCOUNTER — Ambulatory Visit: Payer: Medicare PPO | Admitting: Family Medicine

## 2021-03-19 VITALS — BP 124/72 | HR 95 | Temp 97.8°F | Wt 187.0 lb

## 2021-03-19 DIAGNOSIS — I10 Essential (primary) hypertension: Secondary | ICD-10-CM | POA: Diagnosis not present

## 2021-03-19 MED ORDER — ATORVASTATIN CALCIUM 20 MG PO TABS: 20.0000 mg | ORAL_TABLET | Freq: Every day | ORAL | 3 refills | Status: DC

## 2021-03-19 NOTE — Progress Notes (Signed)
   Subjective:    Patient ID: Diane Martinez, female    DOB: [birth year], 80 y.o.   MRN: [id number]  HPI Here to follow up on BP. She feels great and her BP is stable at home.    Review of Systems  Constitutional: Negative.   Respiratory: Negative.    Cardiovascular: Negative.       Objective:   Physical Exam Constitutional:      Appearance: Normal appearance.  Cardiovascular:     Rate and Rhythm: Normal rate and regular rhythm.     Pulses: Normal pulses.     Heart sounds: Normal heart sounds.  Pulmonary:     Effort: Pulmonary effort is normal.     Breath sounds: Normal breath sounds.  Musculoskeletal:     Right lower leg: No edema.     Left lower leg: No edema.  Neurological:     Mental Status: She is alert.          Assessment & Plan:  Her HTN is well controlled. Recheck in 6 months.  Alysia Penna, MD

## 2021-04-17 ENCOUNTER — Other Ambulatory Visit: Payer: Self-pay | Admitting: Family Medicine

## 2021-07-01 ENCOUNTER — Other Ambulatory Visit: Payer: Self-pay | Admitting: Family Medicine

## 2021-08-06 ENCOUNTER — Ambulatory Visit (INDEPENDENT_AMBULATORY_CARE_PROVIDER_SITE_OTHER): Payer: Medicare PPO

## 2021-08-06 VITALS — Ht 66.0 in | Wt 195.0 lb

## 2021-08-06 DIAGNOSIS — Z0000 Encounter for general adult medical examination without abnormal findings: Secondary | ICD-10-CM

## 2021-08-06 NOTE — Progress Notes (Signed)
I connected with Diane Martinez today by telephone and verified that I am speaking with the correct person using two identifiers. Location patient: home Location provider: work Persons participating in the virtual visit: Luccia, Reinheimer LPN.   I discussed the limitations, risks, security and privacy concerns of performing an evaluation and management service by telephone and the availability of in person appointments. I also discussed with the patient that there may be a patient responsible charge related to this service. The patient expressed understanding and verbally consented to this telephonic visit.    Interactive audio and video telecommunications were attempted between this provider and patient, however failed, due to patient having technical difficulties OR patient did not have access to video capability.  We continued and completed visit with audio only.     Vital signs may be patient reported or missing.  Subjective:   Diane Martinez is a 81 y.o. female who presents for Medicare Annual (Subsequent) preventive examination.  Review of Systems     Cardiac Risk Factors include: advanced age (>70men, >56 women);dyslipidemia;hypertension;obesity (BMI >30kg/m2)     Objective:    Today's Vitals   08/06/21 1511  Weight: 195 lb (88.5 kg)  Height: 5\' 6"  (1.676 m)   Body mass index is 31.47 kg/m.  Advanced Directives 08/06/2021 08/11/2020  Does Patient Have a Medical Advance Directive? Yes Yes  Type of Paramedic of [place];Living will Living will;Healthcare Power of Attorney  Does patient want to make changes to medical advance directive? - No - Patient declined  Copy of [place] in Chart? No - copy requested No - copy requested    Current Medications (verified) Outpatient Encounter Medications as of 08/06/2021  Medication Sig   aspirin 81 MG tablet Take 1 tablet (81 mg total) by mouth daily.   atorvastatin (LIPITOR) 20 MG  tablet Take 1 tablet (20 mg total) by mouth daily.   Calcium Carbonate-Vitamin D 600-400 MG-UNIT per tablet Take 1 tablet by mouth daily. 1200 mg   COD LIVER OIL PO Take by mouth daily.   Cyanocobalamin (B-12) 2500 MCG SUBL Place 1,000 mcg under the tongue daily.   fish oil-omega-3 fatty acids 1000 MG capsule Take 1 g by mouth daily. 1200 mg   hydrALAZINE (APRESOLINE) 50 MG tablet TAKE 1 TABLET BY MOUTH TWICE A DAY   hydrochlorothiazide (HYDRODIURIL) 25 MG tablet TAKE 1 TABLET BY MOUTH EVERY DAY   Multiple Vitamins-Minerals (PRESERVISION AREDS PO) Take by mouth daily.   potassium chloride (KLOR-CON) 10 MEQ tablet Take 1 tablet (10 mEq total) by mouth 2 (two) times daily.   Red Yeast Rice Extract (RED YEAST RICE PO) Take 600 mg by mouth 2 (two) times daily.   potassium chloride (KLOR-CON) 10 MEQ tablet TAKE 1 TABLET BY MOUTH EVERY DAY (Patient not taking: Reported on 08/06/2021)   No facility-administered encounter medications on file as of 08/06/2021.    Allergies (verified) Patient has no known allergies.   History: Past Medical History:  Diagnosis Date   Allergy    sees Dr. Caprice Red    Chronic kidney disease    sees Dr. Corliss Parish    Hypertension    Past Surgical History:  Procedure Laterality Date   ABDOMINAL HYSTERECTOMY     COLONOSCOPY  11-19-11   per Dr. Sharlett Iles, benign polyp, repeat in 10 yrs    TONSILLECTOMY     Family History  Problem Relation Age of Onset   Heart disease Mother  Heart disease Father    Cancer Brother        prostate   Prostate cancer Brother    Diabetes Brother    Diabetes Sister    Diabetes Brother    Colon cancer Neg Hx    Esophageal cancer Neg Hx    Rectal cancer Neg Hx    Stomach cancer Neg Hx    Social History   Socioeconomic History   Marital status: Married    Spouse name: Not on file   Number of children: Not on file   Years of education: Not on file   Highest education level: Not on file  Occupational History    Not on file  Tobacco Use   Smoking status: Former    Types: Cigarettes    Quit date: 11/04/1968    Years since quitting: 52.7   Smokeless tobacco: Never  Vaping Use   Vaping Use: Never used  Substance and Sexual Activity   Alcohol use: No    Alcohol/week: 0.0 standard drinks   Drug use: No   Sexual activity: Not Currently  Other Topics Concern   Not on file  Social History Narrative   Not on file   Social Determinants of Health   Financial Resource Strain: Low Risk    Difficulty of Paying Living Expenses: Not hard at all  Food Insecurity: No Food Insecurity   Worried About Charity fundraiser in the Last Year: Never true   [place] in the Last Year: Never true  Transportation Needs: No Transportation Needs   Lack of Transportation (Medical): No   Lack of Transportation (Non-Medical): No  Physical Activity: Inactive   Days of Exercise per Week: 0 days   Minutes of Exercise per Session: 0 min  Stress: No Stress Concern Present   Feeling of Stress : Not at all  Social Connections: Moderately Integrated   Frequency of Communication with Friends and Family: More than three times a week   Frequency of Social Gatherings with Friends and Family: More than three times a week   Attends Religious Services: More than 4 times per year   Active Member of [employer] or Organizations: Yes   Attends Archivist Meetings: Never   Marital Status: Never married    Tobacco Counseling Counseling given: Not Answered   Clinical Intake:  Pre-visit preparation completed: Yes  Pain : No/denies pain     Nutritional Status: BMI > 30  Obese Nutritional Risks: None Diabetes: No  How often do you need to have someone help you when you read instructions, pamphlets, or other written materials from your doctor or pharmacy?: 1 - Never What is the last grade level you completed in school?: masters degree  Diabetic? no  Interpreter Needed?: No  Information entered by :: NAllen  LPN   Activities of Daily Living In your present state of health, do you have any difficulty performing the following activities: 08/06/2021 08/11/2020  Hearing? N N  Vision? N N  Difficulty concentrating or making decisions? N N  Walking or climbing stairs? N N  Dressing or bathing? N N  Doing errands, shopping? N N  Preparing Food and eating ? N N  Using the Toilet? N N  In the past six months, have you accidently leaked urine? N N  Do you have problems with loss of bowel control? N N  Managing your Medications? N N  Managing your Finances? N N  Housekeeping or managing your Housekeeping? N N  Some recent data might be hidden    Patient Care Team: Laurey Morale, MD as PCP - General  Indicate any recent Medical Services you may have received from other than [facility] providers in the past year (date may be approximate).     Assessment:   This is a routine wellness examination for [facility].  Hearing/Vision screen Vision Screening - Comments:: Regular eye exams, [facility]  Dietary issues and exercise activities discussed: Current Exercise Habits: The patient does not participate in regular exercise at present   Goals Addressed             This Visit's Progress    Patient Stated       08/06/2021, wants to remain independent       Depression Screen PHQ 2/9 Scores 08/06/2021 08/11/2020 10/15/2017 10/09/2016 10/11/2015 10/04/2014  PHQ - 2 Score 0 0 0 0 0 0  PHQ- 9 Score - 0 - - - -    Fall Risk Fall Risk  08/06/2021 08/11/2020 10/15/2017 10/09/2016 10/11/2015  Falls in the past year? 0 0 No No No  Number falls in past yr: - 0 - - -  Injury with Fall? - 0 - - -  Risk for fall due to : Medication side effect No Fall Risks - - -  Follow up Falls evaluation completed;Education provided;Falls prevention discussed Falls evaluation completed;Falls prevention discussed - - -    FALL RISK PREVENTION PERTAINING TO THE HOME:  Any stairs in or around the home? Yes  If so, are there  any without handrails? No  Home free of loose throw rugs in walkways, pet beds, electrical cords, etc? Yes  Adequate lighting in your home to reduce risk of falls? Yes   ASSISTIVE DEVICES UTILIZED TO PREVENT FALLS:  Life alert? No  Use of a cane, walker or w/c? No  Grab bars in the bathroom? No  Shower chair or bench in shower? No  Elevated toilet seat or a handicapped toilet? Yes   TIMED UP AND GO:  Was the test performed? No .       Cognitive Function:     6CIT Screen 08/06/2021  What Year? 0 points  What month? 0 points  What time? 0 points  Count back from 20 0 points  Months in reverse 0 points  Repeat phrase 0 points  Total Score 0    Immunizations Immunization History  Administered Date(s) Administered   PFIZER(Purple Top)SARS-COV-2 Vaccination 10/23/2019, 11/13/2019, 07/02/2020   Pneumococcal Conjugate-13 03/10/2019   Pneumococcal Polysaccharide-23 09/18/2020   Td 01/26/2018    TDAP status: Up to date  Flu Vaccine status: Declined, Education has been provided regarding the importance of this vaccine but patient still declined. Advised may receive this vaccine at local pharmacy or Health Dept. Aware to provide a copy of the vaccination record if obtained from local pharmacy or Health Dept. Verbalized acceptance and understanding.  Pneumococcal vaccine status: Up to date  Covid-19 vaccine status: Completed vaccines  Qualifies for Shingles Vaccine? Yes   Zostavax completed No   Shingrix Completed?: Yes  Screening Tests Health Maintenance  Topic Date Due   Zoster Vaccines- Shingrix (1 of 2) Never done   DEXA SCAN  Never done   COVID-19 Vaccine (4 - Booster for Pfizer series) 08/27/2020   INFLUENZA VACCINE  Never done   TETANUS/TDAP  01/27/2028   Pneumonia Vaccine 75+ Years old  Completed   HPV VACCINES  Aged Out    Health Maintenance  Health Maintenance  Due  Topic Date Due   Zoster Vaccines- Shingrix (1 of 2) Never done   DEXA SCAN  Never done    COVID-19 Vaccine (4 - Booster for Pfizer series) 08/27/2020   INFLUENZA VACCINE  Never done    Colorectal cancer screening: No longer required.   Mammogram status: No longer required due to age.  Bone Density status: Ordered 08/11/2020. Pt provided with contact info and advised to call to schedule appt.  Lung Cancer Screening: (Low Dose CT Chest recommended if Age 27-80 years, 30 pack-year currently smoking OR have quit w/in 15years.) does not qualify.   Lung Cancer Screening Referral: no  Additional Screening:  Hepatitis C Screening: does not qualify;   Vision Screening: Recommended annual ophthalmology exams for early detection of glaucoma and other disorders of the eye. Is the patient up to date with their annual eye exam?  Yes  Who is the provider or what is the name of the office in which the patient attends annual eye exams? [facility] If pt is not established with a provider, would they like to be referred to a provider to establish care? No .   Dental Screening: Recommended annual dental exams for proper oral hygiene  Community Resource Referral / Chronic Care Management: CRR required this visit?  No   CCM required this visit?  No      Plan:     I have personally reviewed and noted the following in the patients chart:   Medical and social history Use of alcohol, tobacco or illicit drugs  Current medications and supplements including opioid prescriptions.  Functional ability and status Nutritional status Physical activity Advanced directives List of other physicians Hospitalizations, surgeries, and ER visits in previous 12 months Vitals Screenings to include cognitive, depression, and falls Referrals and appointments  In addition, I have reviewed and discussed with patient certain preventive protocols, quality metrics, and best practice recommendations. A written personalized care plan for preventive services as well as general preventive health  recommendations were provided to patient.     Kellie Simmering, LPN   03/04/7543   Nurse Notes: none

## 2021-08-06 NOTE — Patient Instructions (Signed)
[name] , Thank you for taking time to come for your Medicare Wellness Visit. I appreciate your ongoing commitment to your health goals. Please review the following plan we discussed and let me know if I can assist you in the future.   Screening recommendations/referrals: Colonoscopy: not required Mammogram: not required Bone Density: due Recommended yearly ophthalmology/optometry visit for glaucoma screening and checkup Recommended yearly dental visit for hygiene and checkup  Vaccinations: Influenza vaccine: decline Pneumococcal vaccine: completed 09/18/2020 Tdap vaccine: completed 01/26/2018, due 01/27/2028 Shingles vaccine: discussed   Covid-19: 07/02/2020, 11/13/2019, 10/23/2019  Advanced directives: Please bring a copy of your POA (Power of Attorney) and/or Living Will to your next appointment.   Conditions/risks identified: none  Next appointment: Follow up in one year for your annual wellness visit    Preventive Care 65 Years and Older, Female Preventive care refers to lifestyle choices and visits with your health care provider that can promote health and wellness. What does preventive care include? A yearly physical exam. This is also called an annual well check. Dental exams once or twice a year. Routine eye exams. Ask your health care provider how often you should have your eyes checked. Personal lifestyle choices, including: Daily care of your teeth and gums. Regular physical activity. Eating a healthy diet. Avoiding tobacco and drug use. Limiting alcohol use. Practicing safe sex. Taking low-dose aspirin every day. Taking vitamin and mineral supplements as recommended by your health care provider. What happens during an annual well check? The services and screenings done by your health care provider during your annual well check will depend on your age, overall health, lifestyle risk factors, and family history of disease. Counseling  Your health care provider may ask  you questions about your: Alcohol use. Tobacco use. Drug use. Emotional well-being. Home and relationship well-being. Sexual activity. Eating habits. History of falls. Memory and ability to understand (cognition). Work and work Statistician. Reproductive health. Screening  You may have the following tests or measurements: Height, weight, and BMI. Blood pressure. Lipid and cholesterol levels. These may be checked every 5 years, or more frequently if you are over 69 years old. Skin check. Lung cancer screening. You may have this screening every year starting at age 67 if you have a 30-pack-year history of smoking and currently smoke or have quit within the past 15 years. Fecal occult blood test (FOBT) of the stool. You may have this test every year starting at age 48. Flexible sigmoidoscopy or colonoscopy. You may have a sigmoidoscopy every 5 years or a colonoscopy every 10 years starting at age 24. Hepatitis C blood test. Hepatitis B blood test. Sexually transmitted disease (STD) testing. Diabetes screening. This is done by checking your blood sugar (glucose) after you have not eaten for a while (fasting). You may have this done every 1-3 years. Bone density scan. This is done to screen for osteoporosis. You may have this done starting at age 18. Mammogram. This may be done every 1-2 years. Talk to your health care provider about how often you should have regular mammograms. Talk with your health care provider about your test results, treatment options, and if necessary, the need for more tests. Vaccines  Your health care provider may recommend certain vaccines, such as: Influenza vaccine. This is recommended every year. Tetanus, diphtheria, and acellular pertussis (Tdap, Td) vaccine. You may need a Td booster every 10 years. Zoster vaccine. You may need this after age 45. Pneumococcal 13-valent conjugate (PCV13) vaccine. One dose is recommended  after age 27. Pneumococcal  polysaccharide (PPSV23) vaccine. One dose is recommended after age 53. Talk to your health care provider about which screenings and vaccines you need and how often you need them. This information is not intended to replace advice given to you by your health care provider. Make sure you discuss any questions you have with your health care provider. Document Released: 08/11/2015 Document Revised: 04/03/2016 Document Reviewed: 05/16/2015 Elsevier Interactive Patient Education  2017 [place] Prevention in the Home Falls can cause injuries. They can happen to people of all ages. There are many things you can do to make your home safe and to help prevent falls. What can I do on the outside of my home? Regularly fix the edges of walkways and driveways and fix any cracks. Remove anything that might make you trip as you walk through a door, such as a raised step or threshold. Trim any bushes or trees on the path to your home. Use bright outdoor lighting. Clear any walking paths of anything that might make someone trip, such as rocks or tools. Regularly check to see if handrails are loose or broken. Make sure that both sides of any steps have handrails. Any raised decks and porches should have guardrails on the edges. Have any leaves, snow, or ice cleared regularly. Use sand or salt on walking paths during winter. Clean up any spills in your garage right away. This includes oil or grease spills. What can I do in the bathroom? Use night lights. Install grab bars by the toilet and in the tub and shower. Do not use towel bars as grab bars. Use non-skid mats or decals in the tub or shower. If you need to sit down in the shower, use a plastic, non-slip stool. Keep the floor dry. Clean up any water that spills on the floor as soon as it happens. Remove soap buildup in the tub or shower regularly. Attach bath mats securely with double-sided non-slip rug tape. Do not have throw rugs and other  things on the floor that can make you trip. What can I do in the bedroom? Use night lights. Make sure that you have a light by your bed that is easy to reach. Do not use any sheets or blankets that are too big for your bed. They should not hang down onto the floor. Have a firm chair that has side arms. You can use this for support while you get dressed. Do not have throw rugs and other things on the floor that can make you trip. What can I do in the kitchen? Clean up any spills right away. Avoid walking on wet floors. Keep items that you use a lot in easy-to-reach places. If you need to reach something above you, use a strong step stool that has a grab bar. Keep electrical cords out of the way. Do not use floor polish or wax that makes floors slippery. If you must use wax, use non-skid floor wax. Do not have throw rugs and other things on the floor that can make you trip. What can I do with my stairs? Do not leave any items on the stairs. Make sure that there are handrails on both sides of the stairs and use them. Fix handrails that are broken or loose. Make sure that handrails are as long as the stairways. Check any carpeting to make sure that it is firmly attached to the stairs. Fix any carpet that is loose or worn. Avoid having throw  rugs at the top or bottom of the stairs. If you do have throw rugs, attach them to the floor with carpet tape. Make sure that you have a light switch at the top of the stairs and the bottom of the stairs. If you do not have them, ask someone to add them for you. What else can I do to help prevent falls? Wear shoes that: Do not have high heels. Have rubber bottoms. Are comfortable and fit you well. Are closed at the toe. Do not wear sandals. If you use a stepladder: Make sure that it is fully opened. Do not climb a closed stepladder. Make sure that both sides of the stepladder are locked into place. Ask someone to hold it for you, if possible. Clearly  mark and make sure that you can see: Any grab bars or handrails. First and last steps. Where the edge of each step is. Use tools that help you move around (mobility aids) if they are needed. These include: Canes. Walkers. Scooters. Crutches. Turn on the lights when you go into a dark area. Replace any light bulbs as soon as they burn out. Set up your furniture so you have a clear path. Avoid moving your furniture around. If any of your floors are uneven, fix them. If there are any pets around you, be aware of where they are. Review your medicines with your doctor. Some medicines can make you feel dizzy. This can increase your chance of falling. Ask your doctor what other things that you can do to help prevent falls. This information is not intended to replace advice given to you by your health care provider. Make sure you discuss any questions you have with your health care provider. Document Released: 05/11/2009 Document Revised: 12/21/2015 Document Reviewed: 08/19/2014 Elsevier Interactive Patient Education  2017 [employer].

## 2021-09-13 ENCOUNTER — Other Ambulatory Visit: Payer: Self-pay | Admitting: Family Medicine

## 2021-09-19 ENCOUNTER — Ambulatory Visit: Payer: Medicare PPO | Admitting: Family Medicine

## 2021-09-19 ENCOUNTER — Encounter: Payer: Self-pay | Admitting: Family Medicine

## 2021-09-19 VITALS — BP 128/76 | HR 66 | Temp 98.4°F | Ht 66.0 in | Wt 189.0 lb

## 2021-09-19 DIAGNOSIS — I10 Essential (primary) hypertension: Secondary | ICD-10-CM | POA: Diagnosis not present

## 2021-09-19 DIAGNOSIS — E782 Mixed hyperlipidemia: Secondary | ICD-10-CM | POA: Diagnosis not present

## 2021-09-19 DIAGNOSIS — N289 Disorder of kidney and ureter, unspecified: Secondary | ICD-10-CM

## 2021-09-19 DIAGNOSIS — R739 Hyperglycemia, unspecified: Secondary | ICD-10-CM

## 2021-09-19 LAB — HEPATIC FUNCTION PANEL
ALT: 18 U/L (ref 0–35)
AST: 23 U/L (ref 0–37)
Albumin: 4.4 g/dL (ref 3.5–5.2)
Alkaline Phosphatase: 132 U/L — ABNORMAL HIGH (ref 39–117)
Bilirubin, Direct: 0.1 mg/dL (ref 0.0–0.3)
Total Bilirubin: 0.6 mg/dL (ref 0.2–1.2)
Total Protein: 8.7 g/dL — ABNORMAL HIGH (ref 6.0–8.3)

## 2021-09-19 LAB — BASIC METABOLIC PANEL
BUN: 35 mg/dL — ABNORMAL HIGH (ref 6–23)
CO2: 33 mEq/L — ABNORMAL HIGH (ref 19–32)
Calcium: 10.5 mg/dL (ref 8.4–10.5)
Chloride: 98 mEq/L (ref 96–112)
Creatinine, Ser: 2.22 mg/dL — ABNORMAL HIGH (ref 0.40–1.20)
GFR: 20.45 mL/min — ABNORMAL LOW (ref >60.00)
Glucose, Bld: 97 mg/dL (ref 70–99)
Potassium: 3.8 mEq/L (ref 3.5–5.1)
Sodium: 137 mEq/L (ref 135–145)

## 2021-09-19 LAB — HEMOGLOBIN A1C: Hgb A1c MFr Bld: 5.9 % (ref 4.6–6.5)

## 2021-09-19 LAB — CBC WITH DIFFERENTIAL/PLATELET
Basophils Absolute: 0 10*3/uL (ref 0.0–0.1)
Basophils Relative: 0.4 % (ref 0.0–3.0)
Eosinophils Absolute: 0.1 10*3/uL (ref 0.0–0.7)
Eosinophils Relative: 1 % (ref 0.0–5.0)
HCT: 46.4 % — ABNORMAL HIGH (ref 36.0–46.0)
Hemoglobin: 14.8 g/dL (ref 12.0–15.0)
Lymphocytes Relative: 22.5 % (ref 12.0–46.0)
Lymphs Abs: 1.1 10*3/uL (ref 0.7–4.0)
MCHC: 31.9 g/dL (ref 30.0–36.0)
MCV: 88 fl (ref 78.0–100.0)
Monocytes Absolute: 0.4 10*3/uL (ref 0.1–1.0)
Monocytes Relative: 8.1 % (ref 3.0–12.0)
Neutro Abs: 3.4 10*3/uL (ref 1.4–7.7)
Neutrophils Relative %: 68 % (ref 43.0–77.0)
Platelets: 181 10*3/uL (ref 150.0–400.0)
RBC: 5.27 Mil/uL — ABNORMAL HIGH (ref 3.87–5.11)
RDW: 15.8 % — ABNORMAL HIGH (ref 11.5–15.5)
WBC: 5 10*3/uL (ref 4.0–10.5)

## 2021-09-19 LAB — LIPID PANEL
Cholesterol: 177 mg/dL (ref 0–200)
HDL: 69.8 mg/dL (ref >39.00)
LDL Cholesterol: 96 mg/dL (ref 0–99)
NonHDL: 107.63
Total CHOL/HDL Ratio: 3
Triglycerides: 58 mg/dL (ref 0.0–149.0)
VLDL: 11.6 mg/dL (ref 0.0–40.0)

## 2021-09-19 LAB — TSH: TSH: 0.82 u[IU]/mL (ref 0.35–5.50)

## 2021-09-19 NOTE — Progress Notes (Signed)
° °  Subjective:    Patient ID: Diane Martinez, female    DOB: May 23, 1941, 81 y.o.   MRN: [id number]  HPI Here to follow up on issues. She feels well. Her BP is stable. She never has ankle swelling. She still drives and stays active.   Review of Systems  Constitutional: Negative.   HENT: Negative.    Eyes: Negative.   Respiratory: Negative.    Cardiovascular: Negative.   Gastrointestinal: Negative.   Genitourinary:  Negative for decreased urine volume, difficulty urinating, dyspareunia, dysuria, enuresis, flank pain, frequency, hematuria, pelvic pain and urgency.  Musculoskeletal: Negative.   Skin: Negative.   Neurological: Negative.  Negative for headaches.  Psychiatric/Behavioral: Negative.        Objective:   Physical Exam Constitutional:      General: She is not in acute distress.    Appearance: Normal appearance. She is well-developed.  HENT:     Head: Normocephalic and atraumatic.     Right Ear: External ear normal.     Left Ear: External ear normal.     Nose: Nose normal.     Mouth/Throat:     Pharynx: No oropharyngeal exudate.  Eyes:     General: No scleral icterus.    Conjunctiva/sclera: Conjunctivae normal.     Pupils: Pupils are equal, round, and reactive to light.  Neck:     Thyroid: No thyromegaly.     Vascular: No JVD.  Cardiovascular:     Rate and Rhythm: Normal rate and regular rhythm.     Heart sounds: Normal heart sounds. No murmur heard.   No friction rub. No gallop.  Pulmonary:     Effort: Pulmonary effort is normal. No respiratory distress.     Breath sounds: Normal breath sounds. No wheezing or rales.  Chest:     Chest wall: No tenderness.  Abdominal:     General: Bowel sounds are normal. There is no distension.     Palpations: Abdomen is soft. There is no mass.     Tenderness: There is no abdominal tenderness. There is no guarding or rebound.  Musculoskeletal:        General: No tenderness. Normal range of motion.     Cervical back: Normal  range of motion and neck supple.  Lymphadenopathy:     Cervical: No cervical adenopathy.  Skin:    General: Skin is warm and dry.     Findings: No erythema or rash.  Neurological:     Mental Status: She is alert and oriented to person, place, and time.     Cranial Nerves: No cranial nerve deficit.     Motor: No abnormal muscle tone.     Coordination: Coordination normal.     Deep Tendon Reflexes: Reflexes are normal and symmetric. Reflexes normal.  Psychiatric:        Behavior: Behavior normal.        Thought Content: Thought content normal.        Judgment: Judgment normal.          Assessment & Plan:  She is doing well in general. Her HTN is well controled. We will get fasting labs today to check her renal function, lipids, etc. She declined a flu shot, as usual, but we encouraged her to get the shingles vaccine. We spent a total of ( 33  ) minutes reviewing records and discussing these issues.  Alysia Penna, MD

## 2021-10-28 ENCOUNTER — Other Ambulatory Visit: Payer: Self-pay | Admitting: Family Medicine

## 2021-12-11 ENCOUNTER — Encounter: Payer: Self-pay | Admitting: Internal Medicine

## 2022-02-03 ENCOUNTER — Other Ambulatory Visit: Payer: Self-pay | Admitting: Family Medicine

## 2022-03-19 ENCOUNTER — Encounter: Payer: Self-pay | Admitting: Family Medicine

## 2022-03-19 ENCOUNTER — Ambulatory Visit: Payer: Medicare PPO | Admitting: Family Medicine

## 2022-03-19 VITALS — BP 128/78 | HR 64 | Temp 97.8°F | Wt 186.0 lb

## 2022-03-19 DIAGNOSIS — I10 Essential (primary) hypertension: Secondary | ICD-10-CM

## 2022-03-19 MED ORDER — ATORVASTATIN CALCIUM 20 MG PO TABS: 20.0000 mg | ORAL_TABLET | Freq: Every day | ORAL | 3 refills | Status: DC

## 2022-03-19 NOTE — Progress Notes (Signed)
   Subjective:    Patient ID: Diane Martinez, female    DOB: Aug 02, 1940, 80 y.o.   MRN: [id number]  HPI Here to follow up on HTN. She feels great and her BP at home has been stable.    Review of Systems  Constitutional: Negative.   Respiratory: Negative.    Cardiovascular: Negative.        Objective:   Physical Exam Constitutional:      Appearance: Normal appearance.  Cardiovascular:     Rate and Rhythm: Normal rate and regular rhythm.     Pulses: Normal pulses.     Heart sounds: Normal heart sounds.  Pulmonary:     Effort: Pulmonary effort is normal.     Breath sounds: Normal breath sounds.  Musculoskeletal:     Right lower leg: No edema.     Left lower leg: No edema.  Neurological:     Mental Status: She is alert.           Assessment & Plan:  HTN, well controlled. We will see her back in 6 months.  Alysia Penna, MD

## 2022-04-15 ENCOUNTER — Other Ambulatory Visit: Payer: Self-pay | Admitting: Family Medicine

## 2022-04-16 ENCOUNTER — Other Ambulatory Visit: Payer: Self-pay

## 2022-04-16 ENCOUNTER — Telehealth: Payer: Self-pay | Admitting: Family Medicine

## 2022-04-16 MED ORDER — POTASSIUM CHLORIDE CRYS ER 10 MEQ PO TBCR: 10.0000 meq | EXTENDED_RELEASE_TABLET | Freq: Two times a day (BID) | ORAL | 1 refills | Status: DC

## 2022-04-16 NOTE — Telephone Encounter (Signed)
Pt is calling and her KLOR-CON M10 10 MEQ tablet was denied and pt would like to know if she still suppose to be taking med

## 2022-04-16 NOTE — Telephone Encounter (Signed)
Spoke with patient, Klor-Con M10 resent to [facility] on [address]

## 2022-05-03 ENCOUNTER — Other Ambulatory Visit: Payer: Self-pay | Admitting: Family Medicine

## 2022-05-22 ENCOUNTER — Encounter: Payer: Self-pay | Admitting: Family Medicine

## 2022-05-22 ENCOUNTER — Telehealth (INDEPENDENT_AMBULATORY_CARE_PROVIDER_SITE_OTHER): Payer: Medicare PPO | Admitting: Family Medicine

## 2022-05-22 VITALS — Wt 188.0 lb

## 2022-05-22 DIAGNOSIS — J302 Other seasonal allergic rhinitis: Secondary | ICD-10-CM

## 2022-05-22 MED ORDER — FEXOFENADINE HCL 60 MG PO TABS: 60.0000 mg | ORAL_TABLET | Freq: Every day | ORAL | 0 refills | Status: DC

## 2022-05-22 NOTE — Progress Notes (Signed)
Virtual Visit via Telephone Note  I connected with Diane Martinez on 05/22/22 at  4:15 PM EDT by telephone and verified that I am speaking with the correct person using two identifiers.   I discussed the limitations, risks, security and privacy concerns of performing an evaluation and management service by telephone and the availability of in person appointments. I also discussed with the patient that there may be a patient responsible charge related to this service. The patient expressed understanding and agreed to proceed.  Location patient: home Location provider: work or home office Participants present for the call: patient, provider Patient did not have a visit in the prior 7 days to address this/these issue(s). Chief Complaint  Patient presents with   Allergic Rhinitis    Cough     History of Present Illness: Pt is an 81 yo female with pmh sig for HTN, HLD Pt with rhinorrhea, dry cough, intermittent HAs x 1 day. Denies nasal congestion, sore throat, ear pain and pressure, facial pain or pressure, fever, chills, sick contacts. Pt took tylenol.   Observations/Objective: Patient sounds cheerful and well on the phone. I do not appreciate any SOB. Speech and thought processing are grossly intact. Patient reported vitals:  Assessment and Plan: Seasonal allergies  -Advised symptoms likely 2/2 seasonal allergies -Patient advised to take an at home COVID test -We will start Allegra and saline nasal rinse to see if symptoms improve. -Okay to continue supportive care including Tylenol as needed for pain/discomfort -Patient given strict precautions for continued or worsening symptoms. - Plan: fexofenadine (ALLEGRA ALLERGY) 60 MG tablet   Follow Up Instructions: F/u prn   [id number] [id number] [id number] 11-20 [id number] 21-30 I did not refer this patient for an OV in the next 24 hours for this/these issue(s).  I discussed the assessment and treatment plan with the patient. The patient was  provided an opportunity to ask questions and all were answered. The patient agreed with the plan and demonstrated an understanding of the instructions.   The patient was advised to call back or seek an in-person evaluation if the symptoms worsen or if the condition fails to improve as anticipated.  I provided 6 minutes of non-face-to-face time during this encounter.   Billie Ruddy, MD

## 2022-07-01 ENCOUNTER — Other Ambulatory Visit: Payer: Self-pay | Admitting: Family Medicine

## 2022-07-01 DIAGNOSIS — I10 Essential (primary) hypertension: Secondary | ICD-10-CM

## 2022-07-04 NOTE — Telephone Encounter (Signed)
Pt called to say pharmacy called her about a medication that starts with the letter "H" and she is not sure what they were talking about.  Pt was not sure if it was for the  hydrALAZINE (APRESOLINE) 50 MG tablet  Or the: hydrochlorothiazide (HYDRODIURIL) 25 MG tablet  LOV:  03/19/22  Please advise.

## 2022-08-12 ENCOUNTER — Ambulatory Visit: Payer: Medicare PPO

## 2022-08-12 ENCOUNTER — Ambulatory Visit (INDEPENDENT_AMBULATORY_CARE_PROVIDER_SITE_OTHER): Payer: Medicare PPO

## 2022-08-12 VITALS — Ht 66.0 in | Wt 186.0 lb

## 2022-08-12 DIAGNOSIS — Z0000 Encounter for general adult medical examination without abnormal findings: Secondary | ICD-10-CM

## 2022-08-12 DIAGNOSIS — Z13820 Encounter for screening for osteoporosis: Secondary | ICD-10-CM | POA: Diagnosis not present

## 2022-08-12 NOTE — Patient Instructions (Addendum)
Diane Martinez , Thank you for taking time to come for your Medicare Wellness Visit. I appreciate your ongoing commitment to your health goals. Please review the following plan we discussed and let me know if I can assist you in the future.   These are the goals we discussed:  Goals       Exercise 150 min/wk Moderate Activity      Patient Stated      08/06/2021, wants to remain independent      Stay healthy (pt-stated)        This is a list of the screening recommended for you and due dates:  Health Maintenance  Topic Date Due   DEXA scan (bone density measurement)  Never done   COVID-19 Vaccine (4 - 2023-24 season) 08/28/2022*   Flu Shot  10/27/2022*   Zoster (Shingles) Vaccine (1 of 2) 11/11/2022*   Medicare Annual Wellness Visit  08/13/2023   DTaP/Tdap/Td vaccine (2 - Tdap) 01/27/2028   Pneumonia Vaccine  Completed   HPV Vaccine  Aged Out  *Topic was postponed. The date shown is not the original due date.    Advanced directives: .ahad  Conditions/risks identified: None  Next appointment: Follow up in one year for your annual wellness visit     Preventive Care 65 Years and Older, Female Preventive care refers to lifestyle choices and visits with your health care provider that can promote health and wellness. What does preventive care include? A yearly physical exam. This is also called an annual well check. Dental exams once or twice a year. Routine eye exams. Ask your health care provider how often you should have your eyes checked. Personal lifestyle choices, including: Daily care of your teeth and gums. Regular physical activity. Eating a healthy diet. Avoiding tobacco and drug use. Limiting alcohol use. Practicing safe sex. Taking low-dose aspirin every day. Taking vitamin and mineral supplements as recommended by your health care provider. What happens during an annual well check? The services and screenings done by your health care provider during your annual  well check will depend on your age, overall health, lifestyle risk factors, and family history of disease. Counseling  Your health care provider may ask you questions about your: Alcohol use. Tobacco use. Drug use. Emotional well-being. Home and relationship well-being. Sexual activity. Eating habits. History of falls. Memory and ability to understand (cognition). Work and work Statistician. Reproductive health. Screening  You may have the following tests or measurements: Height, weight, and BMI. Blood pressure. Lipid and cholesterol levels. These may be checked every 5 years, or more frequently if you are over 15 years old. Skin check. Lung cancer screening. You may have this screening every year starting at age 88 if you have a 30-pack-year history of smoking and currently smoke or have quit within the past 15 years. Fecal occult blood test (FOBT) of the stool. You may have this test every year starting at age 56. Flexible sigmoidoscopy or colonoscopy. You may have a sigmoidoscopy every 5 years or a colonoscopy every 10 years starting at age 56. Hepatitis C blood test. Hepatitis B blood test. Sexually transmitted disease (STD) testing. Diabetes screening. This is done by checking your blood sugar (glucose) after you have not eaten for a while (fasting). You may have this done every 1-3 years. Bone density scan. This is done to screen for osteoporosis. You may have this done starting at age 79. Mammogram. This may be done every 1-2 years. Talk to your health care provider about  how often you should have regular mammograms. Talk with your health care provider about your test results, treatment options, and if necessary, the need for more tests. Vaccines  Your health care provider may recommend certain vaccines, such as: Influenza vaccine. This is recommended every year. Tetanus, diphtheria, and acellular pertussis (Tdap, Td) vaccine. You may need a Td booster every 10 years. Zoster  vaccine. You may need this after age 51. Pneumococcal 13-valent conjugate (PCV13) vaccine. One dose is recommended after age 41. Pneumococcal polysaccharide (PPSV23) vaccine. One dose is recommended after age 33. Talk to your health care provider about which screenings and vaccines you need and how often you need them. This information is not intended to replace advice given to you by your health care provider. Make sure you discuss any questions you have with your health care provider. Document Released: 08/11/2015 Document Revised: 04/03/2016 Document Reviewed: 05/16/2015 Elsevier Interactive Patient Education  2017 [employer] Prevention in the Home Falls can cause injuries. They can happen to people of all ages. There are many things you can do to make your home safe and to help prevent falls. What can I do on the outside of my home? Regularly fix the edges of walkways and driveways and fix any cracks. Remove anything that might make you trip as you walk through a door, such as a raised step or threshold. Trim any bushes or trees on the path to your home. Use bright outdoor lighting. Clear any walking paths of anything that might make someone trip, such as rocks or tools. Regularly check to see if handrails are loose or broken. Make sure that both sides of any steps have handrails. Any raised decks and porches should have guardrails on the edges. Have any leaves, snow, or ice cleared regularly. Use sand or salt on walking paths during winter. Clean up any spills in your garage right away. This includes oil or grease spills. What can I do in the bathroom? Use night lights. Install grab bars by the toilet and in the tub and shower. Do not use towel bars as grab bars. Use non-skid mats or decals in the tub or shower. If you need to sit down in the shower, use a plastic, non-slip stool. Keep the floor dry. Clean up any water that spills on the floor as soon as it happens. Remove  soap buildup in the tub or shower regularly. Attach bath mats securely with double-sided non-slip rug tape. Do not have throw rugs and other things on the floor that can make you trip. What can I do in the bedroom? Use night lights. Make sure that you have a light by your bed that is easy to reach. Do not use any sheets or blankets that are too big for your bed. They should not hang down onto the floor. Have a firm chair that has side arms. You can use this for support while you get dressed. Do not have throw rugs and other things on the floor that can make you trip. What can I do in the kitchen? Clean up any spills right away. Avoid walking on wet floors. Keep items that you use a lot in easy-to-reach places. If you need to reach something above you, use a strong step stool that has a grab bar. Keep electrical cords out of the way. Do not use floor polish or wax that makes floors slippery. If you must use wax, use non-skid floor wax. Do not have throw rugs and other  things on the floor that can make you trip. What can I do with my stairs? Do not leave any items on the stairs. Make sure that there are handrails on both sides of the stairs and use them. Fix handrails that are broken or loose. Make sure that handrails are as long as the stairways. Check any carpeting to make sure that it is firmly attached to the stairs. Fix any carpet that is loose or worn. Avoid having throw rugs at the top or bottom of the stairs. If you do have throw rugs, attach them to the floor with carpet tape. Make sure that you have a light switch at the top of the stairs and the bottom of the stairs. If you do not have them, ask someone to add them for you. What else can I do to help prevent falls? Wear shoes that: Do not have high heels. Have rubber bottoms. Are comfortable and fit you well. Are closed at the toe. Do not wear sandals. If you use a stepladder: Make sure that it is fully opened. Do not climb a  closed stepladder. Make sure that both sides of the stepladder are locked into place. Ask someone to hold it for you, if possible. Clearly mark and make sure that you can see: Any grab bars or handrails. First and last steps. Where the edge of each step is. Use tools that help you move around (mobility aids) if they are needed. These include: Canes. Walkers. Scooters. Crutches. Turn on the lights when you go into a dark area. Replace any light bulbs as soon as they burn out. Set up your furniture so you have a clear path. Avoid moving your furniture around. If any of your floors are uneven, fix them. If there are any pets around you, be aware of where they are. Review your medicines with your doctor. Some medicines can make you feel dizzy. This can increase your chance of falling. Ask your doctor what other things that you can do to help prevent falls. This information is not intended to replace advice given to you by your health care provider. Make sure you discuss any questions you have with your health care provider. Document Released: 05/11/2009 Document Revised: 12/21/2015 Document Reviewed: 08/19/2014 Elsevier Interactive Patient Education  2017 [employer].

## 2022-08-12 NOTE — Progress Notes (Signed)
Subjective:   Diane Martinez is a 82 y.o. female who presents for Medicare Annual (Subsequent) preventive examination.  Review of Systems    Virtual Visit via Telephone Note  I connected with  Diane Martinez on 08/12/22 at  3:30 PM EST by telephone and verified that I am speaking with the correct person using two identifiers.  Location: Patient: Home Provider: Office Persons participating in the virtual visit: patient/Nurse Health Advisor   I discussed the limitations, risks, security and privacy concerns of performing an evaluation and management service by telephone and the availability of in person appointments. The patient expressed understanding and agreed to proceed.  Interactive audio and video telecommunications were attempted between this nurse and patient, however failed, due to patient having technical difficulties OR patient did not have access to video capability.  We continued and completed visit with audio only.  Some vital signs may be absent or patient reported.   Criselda Peaches, LPN  Cardiac Risk Factors include: advanced age (>15mn, >>26women);hypertension     Objective:    Today's Vitals   08/12/22 1536  Weight: 186 lb (84.4 kg)  Height: '5\' 6"'$  (1.676 m)   Body mass index is 30.02 kg/m.     08/12/2022    3:42 PM 08/06/2021    3:17 PM 08/11/2020   11:23 AM  Advanced Directives  Does Patient Have a Medical Advance Directive? Yes Yes Yes  Type of [place];Healthcare Power of Attorney  Does patient want to make changes to medical advance directive?   No - Patient declined  Copy of [place] Chart? No - copy requested No - copy requested No - copy requested    Current Medications (verified) Outpatient Encounter Medications as of 08/12/2022  Medication Sig   aspirin 81 MG tablet Take 1 tablet (81 mg total) by mouth daily.    atorvastatin (LIPITOR) 20 MG tablet Take 1 tablet (20 mg total) by mouth daily.   Calcium Carbonate-Vitamin D 600-400 MG-UNIT per tablet Take 1 tablet by mouth daily. 1200 mg   COD LIVER OIL PO Take by mouth daily.   Cyanocobalamin (B-12) 2500 MCG SUBL Place 1,000 mcg under the tongue daily.   fexofenadine (ALLEGRA ALLERGY) 60 MG tablet Take 1 tablet (60 mg total) by mouth daily.   fish oil-omega-3 fatty acids 1000 MG capsule Take 1 g by mouth daily. 1200 mg   hydrALAZINE (APRESOLINE) 50 MG tablet TAKE 1 TABLET BY MOUTH TWICE A DAY   hydrochlorothiazide (HYDRODIURIL) 25 MG tablet TAKE 1 TABLET BY MOUTH EVERY DAY   Multiple Vitamins-Minerals (PRESERVISION AREDS PO) Take by mouth daily.   potassium chloride (KLOR-CON M10) 10 MEQ tablet Take 1 tablet (10 mEq total) by mouth 2 (two) times daily.   Red Yeast Rice Extract (RED YEAST RICE PO) Take 600 mg by mouth 2 (two) times daily.   No facility-administered encounter medications on file as of 08/12/2022.    Allergies (verified) Patient has no known allergies.   History: Past Medical History:  Diagnosis Date   Allergy    sees Dr. ECaprice Red   Chronic kidney disease    sees Dr. KCorliss Parish   Hypertension    Past Surgical History:  Procedure Laterality Date   ABDOMINAL HYSTERECTOMY     COLONOSCOPY  11-19-11   per Dr. pSharlett Iles benign polyp, repeat in 10 yrs    TONSILLECTOMY  Family History  Problem Relation Age of Onset   Heart disease Mother    Heart disease Father    Cancer Brother        prostate   Prostate cancer Brother    Diabetes Brother    Diabetes Sister    Diabetes Brother    Colon cancer Neg Hx    Esophageal cancer Neg Hx    Rectal cancer Neg Hx    Stomach cancer Neg Hx    Social History   Socioeconomic History   Marital status: Married    Spouse name: Not on file   Number of children: Not on file   Years of education: Not on file   Highest education level: Not on file  Occupational History    Not on file  Tobacco Use   Smoking status: Former    Types: Cigarettes    Quit date: 11/04/1968    Years since quitting: 53.8   Smokeless tobacco: Never  Vaping Use   Vaping Use: Never used  Substance and Sexual Activity   Alcohol use: No    Alcohol/week: 0.0 standard drinks of alcohol   Drug use: No   Sexual activity: Not Currently  Other Topics Concern   Not on file  Social History Narrative   Not on file   Social Determinants of Health   Financial Resource Strain: Low Risk  (08/12/2022)   Overall Financial Resource Strain (CARDIA)    Difficulty of Paying Living Expenses: Not hard at all  Food Insecurity: No Food Insecurity (08/12/2022)   Hunger Vital Sign    Worried About Running Out of Food in the Last Year: Never true    [place] in the Last Year: Never true  Transportation Needs: No Transportation Needs (08/12/2022)   PRAPARE - Hydrologist (Medical): No    Lack of Transportation (Non-Medical): No  Physical Activity: Inactive (08/12/2022)   Exercise Vital Sign    Days of Exercise per Week: 0 days    Minutes of Exercise per Session: 0 min  Stress: No Stress Concern Present (08/12/2022)   [place]    Feeling of Stress : Not at all  Social Connections: Moderately Integrated (08/12/2022)   Social Connection and Isolation Panel [NHANES]    Frequency of Communication with Friends and Family: More than three times a week    Frequency of Social Gatherings with Friends and Family: More than three times a week    Attends Religious Services: More than 4 times per year    Active Member of [employer] or Organizations: Yes    Attends Music therapist: More than 4 times per year    Marital Status: Never married    Tobacco Counseling Counseling given: Not Answered   Clinical Intake:  Pre-visit preparation completed: No  Pain : No/denies pain     BMI - recorded:  30.02 Nutritional Status: BMI > 30  Obese Nutritional Risks: None Diabetes: No  How often do you need to have someone help you when you read instructions, pamphlets, or other written materials from your doctor or pharmacy?: 1 - Never  Diabetic? No  Interpreter Needed?: No  Information entered by :: Rolene Arbour LPN   Activities of Daily Living    08/12/2022    3:41 PM  In your present state of health, do you have any difficulty performing the following activities:  Hearing? 0  Vision? 0  Difficulty concentrating  or making decisions? 0  Walking or climbing stairs? 0  Dressing or bathing? 0  Doing errands, shopping? 0  Preparing Food and eating ? N  Using the Toilet? N  In the past six months, have you accidently leaked urine? N  Do you have problems with loss of bowel control? N  Managing your Medications? N  Managing your Finances? N  Housekeeping or managing your Housekeeping? N    Patient Care Team: Laurey Morale, MD as PCP - General  Indicate any recent Medical Services you may have received from other than [facility] providers in the past year (date may be approximate).     Assessment:   This is a routine wellness examination for [facility].  Hearing/Vision screen Hearing Screening - Comments:: Denies hearing difficulties   Vision Screening - Comments:: Wears rx glasses - up to date with routine eye exams with  Dr Katy Fitch  Dietary issues and exercise activities discussed: Exercise limited by: None identified   Goals Addressed               This Visit's Progress     Stay healthy (pt-stated)         Depression Screen    08/12/2022    3:40 PM 03/19/2022    9:59 AM 09/19/2021   11:12 AM 08/06/2021    3:18 PM 08/11/2020   11:25 AM 10/15/2017    9:06 AM 10/09/2016    9:00 AM  PHQ 2/9 Scores  PHQ - 2 Score 0 3 0 0 0 0 0  PHQ- 9 Score  4 0  0      Fall Risk    08/12/2022    3:41 PM 03/19/2022    9:59 AM 09/19/2021   11:13 AM 09/19/2021   11:12 AM 08/06/2021     3:18 PM  Fall Risk   Falls in the past year? 0 0 0 0 0  Number falls in past yr: 0 0 0 0   Injury with Fall? 0 0 0 0   Risk for fall due to : No Fall Risks No Fall Risks No Fall Risks No Fall Risks Medication side effect  Follow up Falls prevention discussed Falls evaluation completed   Falls evaluation completed;Education provided;Falls prevention discussed    FALL RISK PREVENTION PERTAINING TO THE HOME:  Any stairs in or around the home? Yes  If so, are there any without handrails? No  Home free of loose throw rugs in walkways, pet beds, electrical cords, etc? Yes  Adequate lighting in your home to reduce risk of falls? Yes   ASSISTIVE DEVICES UTILIZED TO PREVENT FALLS:  Life alert? No  Use of a cane, walker or w/c? No  Grab bars in the bathroom? Yes  Shower chair or bench in shower? Yes  Elevated toilet seat or a handicapped toilet? Yes   TIMED UP AND GO:  Was the test performed? No . Audio Visit  Cognitive Function:        08/12/2022    3:42 PM 08/06/2021    3:19 PM  6CIT Screen  What Year? 0 points 0 points  What month? 0 points 0 points  What time? 0 points 0 points  Count back from 20 0 points 0 points  Months in reverse 0 points 0 points  Repeat phrase 0 points 0 points  Total Score 0 points 0 points    Immunizations Immunization History  Administered Date(s) Administered   PFIZER(Purple Top)SARS-COV-2 Vaccination 10/23/2019, 11/13/2019, 07/02/2020   Pneumococcal Conjugate-13  03/10/2019   Pneumococcal Polysaccharide-23 09/18/2020   Td 01/26/2018    TDAP status: Up to date    Pneumococcal vaccine status: Up to date  Covid-19 vaccine status: Completed vaccines  Qualifies for Shingles Vaccine? Yes   Zostavax completed No   Shingrix Completed?: No.    Education has been provided regarding the importance of this vaccine. Patient has been advised to call insurance company to determine out of pocket expense if they have not yet received this vaccine.  Advised may also receive vaccine at local pharmacy or Health Dept. Verbalized acceptance and understanding.  Screening Tests Health Maintenance  Topic Date Due   DEXA SCAN  Never done   COVID-19 Vaccine (4 - 2023-24 season) 08/28/2022 (Originally 03/29/2022)   INFLUENZA VACCINE  10/27/2022 (Originally 02/26/2022)   Zoster Vaccines- Shingrix (1 of 2) 11/11/2022 (Originally 04/28/1991)   Medicare Annual Wellness (AWV)  08/13/2023   DTaP/Tdap/Td (2 - Tdap) 01/27/2028   Pneumonia Vaccine 67+ Years old  Completed   HPV VACCINES  Aged Out    Health Maintenance  Health Maintenance Due  Topic Date Due   DEXA SCAN  Never done    Colorectal cancer screening: No longer required.   Mammogram status: No longer required due to Age.  Bone Density status: Ordered 08/12/22. Pt provided with contact info and advised to call to schedule appt.  Lung Cancer Screening: (Low Dose CT Chest recommended if Age 60-80 years, 30 pack-year currently smoking OR have quit w/in 15years.) does not qualify.     Additional Screening:  Hepatitis C Screening: does not qualify; Completed   Vision Screening: Recommended annual ophthalmology exams for early detection  Is the patient up to date with their annual eye exam?  Yes  Who is the provider or what is the name of the office in which the patient attends annual eye exams? Dr Katy Fitch If pt is not established with a provider, would they like to be referred to a provider to establish care? No .   Dental Screening: Recommended annual dental exams for proper oral hygiene  Community Resource Referral / Chronic Care Management:  CRR required this visit?  No   CCM required this visit?  No      Plan:     I have personally reviewed and noted the following in the patient's chart:   Medical and social history Use of alcohol, tobacco or illicit drugs  Current medications and supplements including opioid prescriptions. Patient is not currently taking opioid  prescriptions. Functional ability and status Nutritional status Physical activity Advanced directives List of other physicians Hospitalizations, surgeries, and ER visits in previous 12 months Vitals Screenings to include cognitive, depression, and falls Referrals and appointments  In addition, I have reviewed and discussed with patient certain preventive protocols, quality metrics, and best practice recommendations. A written personalized care plan for preventive services as well as general preventive health recommendations were provided to patient.     Criselda Peaches, LPN   0/15/6153   Nurse Notes: None

## 2022-09-17 ENCOUNTER — Ambulatory Visit (INDEPENDENT_AMBULATORY_CARE_PROVIDER_SITE_OTHER): Payer: Medicare PPO | Admitting: Family Medicine

## 2022-09-17 ENCOUNTER — Encounter: Payer: Self-pay | Admitting: Family Medicine

## 2022-09-17 VITALS — BP 118/78 | HR 66 | Temp 97.5°F | Ht 66.0 in | Wt 185.4 lb

## 2022-09-17 DIAGNOSIS — J302 Other seasonal allergic rhinitis: Secondary | ICD-10-CM

## 2022-09-17 DIAGNOSIS — N289 Disorder of kidney and ureter, unspecified: Secondary | ICD-10-CM | POA: Diagnosis not present

## 2022-09-17 DIAGNOSIS — R739 Hyperglycemia, unspecified: Secondary | ICD-10-CM | POA: Diagnosis not present

## 2022-09-17 DIAGNOSIS — I10 Essential (primary) hypertension: Secondary | ICD-10-CM | POA: Diagnosis not present

## 2022-09-17 DIAGNOSIS — E782 Mixed hyperlipidemia: Secondary | ICD-10-CM

## 2022-09-17 DIAGNOSIS — N184 Chronic kidney disease, stage 4 (severe): Secondary | ICD-10-CM

## 2022-09-17 LAB — BASIC METABOLIC PANEL
BUN: 35 mg/dL — ABNORMAL HIGH (ref 6–23)
CO2: 28 mEq/L (ref 19–32)
Calcium: 10.5 mg/dL (ref 8.4–10.5)
Chloride: 96 mEq/L (ref 96–112)
Creatinine, Ser: 2.19 mg/dL — ABNORMAL HIGH (ref 0.40–1.20)
GFR: 20.64 mL/min — ABNORMAL LOW (ref >60.00)
Glucose, Bld: 107 mg/dL — ABNORMAL HIGH (ref 70–99)
Potassium: 3.9 mEq/L (ref 3.5–5.1)
Sodium: 138 mEq/L (ref 135–145)

## 2022-09-17 LAB — CBC WITH DIFFERENTIAL/PLATELET
Basophils Absolute: 0 K/uL (ref 0.0–0.1)
Basophils Relative: 0.7 % (ref 0.0–3.0)
Eosinophils Absolute: 0.2 K/uL (ref 0.0–0.7)
Eosinophils Relative: 3.9 % (ref 0.0–5.0)
HCT: 47.5 % — ABNORMAL HIGH (ref 36.0–46.0)
Hemoglobin: 15.4 g/dL — ABNORMAL HIGH (ref 12.0–15.0)
Lymphocytes Relative: 19 % (ref 12.0–46.0)
Lymphs Abs: 1 K/uL (ref 0.7–4.0)
MCHC: 32.4 g/dL (ref 30.0–36.0)
MCV: 88.2 fl (ref 78.0–100.0)
Monocytes Absolute: 0.3 K/uL (ref 0.1–1.0)
Monocytes Relative: 6.2 % (ref 3.0–12.0)
Neutro Abs: 3.8 K/uL (ref 1.4–7.7)
Neutrophils Relative %: 70.2 % (ref 43.0–77.0)
Platelets: 188 K/uL (ref 150.0–400.0)
RBC: 5.39 Mil/uL — ABNORMAL HIGH (ref 3.87–5.11)
RDW: 15.1 % (ref 11.5–15.5)
WBC: 5.5 K/uL (ref 4.0–10.5)

## 2022-09-17 LAB — TSH: TSH: 1.04 u[IU]/mL (ref 0.35–5.50)

## 2022-09-17 LAB — HEPATIC FUNCTION PANEL
ALT: 16 U/L (ref 0–35)
AST: 22 U/L (ref 0–37)
Albumin: 4.3 g/dL (ref 3.5–5.2)
Alkaline Phosphatase: 132 U/L — ABNORMAL HIGH (ref 39–117)
Bilirubin, Direct: 0.2 mg/dL (ref 0.0–0.3)
Total Bilirubin: 0.6 mg/dL (ref 0.2–1.2)
Total Protein: 8.9 g/dL — ABNORMAL HIGH (ref 6.0–8.3)

## 2022-09-17 LAB — LIPID PANEL
Cholesterol: 182 mg/dL (ref 0–200)
HDL: 67.4 mg/dL
LDL Cholesterol: 101 mg/dL — ABNORMAL HIGH (ref 0–99)
NonHDL: 114.77
Total CHOL/HDL Ratio: 3
Triglycerides: 70 mg/dL (ref 0.0–149.0)
VLDL: 14 mg/dL (ref 0.0–40.0)

## 2022-09-17 LAB — HEMOGLOBIN A1C: Hgb A1c MFr Bld: 5.9 % (ref 4.6–6.5)

## 2022-09-17 MED ORDER — ATORVASTATIN CALCIUM 20 MG PO TABS: 20.0000 mg | ORAL_TABLET | Freq: Every day | ORAL | 3 refills | Status: DC

## 2022-09-17 MED ORDER — HYDROCHLOROTHIAZIDE 25 MG PO TABS: 25.0000 mg | ORAL_TABLET | Freq: Every day | ORAL | 3 refills | Status: DC

## 2022-09-17 MED ORDER — POTASSIUM CHLORIDE CRYS ER 10 MEQ PO TBCR: 10.0000 meq | EXTENDED_RELEASE_TABLET | Freq: Two times a day (BID) | ORAL | 3 refills | Status: AC

## 2022-09-17 MED ORDER — FEXOFENADINE HCL 60 MG PO TABS: 60.0000 mg | ORAL_TABLET | Freq: Every day | ORAL | 3 refills | Status: AC

## 2022-09-17 MED ORDER — HYDRALAZINE HCL 50 MG PO TABS: 50.0000 mg | ORAL_TABLET | Freq: Two times a day (BID) | ORAL | 3 refills | Status: DC

## 2022-09-17 NOTE — Progress Notes (Signed)
   Subjective:    Patient ID: Diane Martinez, female    DOB: 1941/01/07, 82 y.o.   MRN: [id number]  HPI Here to follow up on issues. She feels fine and has no concerns. Her BP is stable. Her allergies are stable. She has not seen Dr. Moshe Cipro for a nephrology follow up yet this year.    Review of Systems  Constitutional: Negative.   HENT: Negative.    Eyes: Negative.   Respiratory: Negative.    Cardiovascular: Negative.   Gastrointestinal: Negative.   Genitourinary:  Negative for decreased urine volume, difficulty urinating, dyspareunia, dysuria, enuresis, flank pain, frequency, hematuria, pelvic pain and urgency.  Musculoskeletal: Negative.   Skin: Negative.   Neurological: Negative.  Negative for headaches.  Psychiatric/Behavioral: Negative.         Objective:   Physical Exam Constitutional:      General: She is not in acute distress.    Appearance: Normal appearance. She is well-developed.  HENT:     Head: Normocephalic and atraumatic.     Right Ear: External ear normal.     Left Ear: External ear normal.     Nose: Nose normal.     Mouth/Throat:     Pharynx: No oropharyngeal exudate.  Eyes:     General: No scleral icterus.    Conjunctiva/sclera: Conjunctivae normal.     Pupils: Pupils are equal, round, and reactive to light.  Neck:     Thyroid: No thyromegaly.     Vascular: No JVD.  Cardiovascular:     Rate and Rhythm: Normal rate and regular rhythm.     Heart sounds: Normal heart sounds. No murmur heard.    No friction rub. No gallop.  Pulmonary:     Effort: Pulmonary effort is normal. No respiratory distress.     Breath sounds: Normal breath sounds. No wheezing or rales.  Chest:     Chest wall: No tenderness.  Abdominal:     General: Bowel sounds are normal. There is no distension.     Palpations: Abdomen is soft. There is no mass.     Tenderness: There is no abdominal tenderness. There is no guarding or rebound.  Musculoskeletal:        General: No  tenderness. Normal range of motion.     Cervical back: Normal range of motion and neck supple.  Lymphadenopathy:     Cervical: No cervical adenopathy.  Skin:    General: Skin is warm and dry.     Findings: No erythema or rash.  Neurological:     Mental Status: She is alert and oriented to person, place, and time.     Cranial Nerves: No cranial nerve deficit.     Motor: No abnormal muscle tone.     Coordination: Coordination normal.     Deep Tendon Reflexes: Reflexes are normal and symmetric. Reflexes normal.  Psychiatric:        Behavior: Behavior normal.        Thought Content: Thought content normal.        Judgment: Judgment normal.           Assessment & Plan:  Her HTN and allergies are well controlled. We will get fasting labs to check lipids, renal function ,etc. She will contact Dr. Moshe Cipro to set up an appt soon. She is scheduled for a DEXA on 01-28-23. We spent a total of ( 33  ) minutes reviewing records and discussing these issues.  Alysia Penna, MD

## 2023-01-28 ENCOUNTER — Other Ambulatory Visit: Payer: Medicare PPO

## 2023-03-18 ENCOUNTER — Ambulatory Visit: Payer: Medicare PPO | Admitting: Family Medicine

## 2023-04-18 ENCOUNTER — Encounter: Payer: Self-pay | Admitting: Family Medicine

## 2023-04-18 ENCOUNTER — Ambulatory Visit: Payer: Medicare PPO | Admitting: Family Medicine

## 2023-04-18 VITALS — BP 124/70 | HR 78 | Temp 97.8°F | Wt 187.2 lb

## 2023-04-18 DIAGNOSIS — I10 Essential (primary) hypertension: Secondary | ICD-10-CM

## 2023-04-18 DIAGNOSIS — Z23 Encounter for immunization: Secondary | ICD-10-CM

## 2023-04-18 NOTE — Addendum Note (Signed)
Addended by: Carola Rhine on: 04/18/2023 10:23 AM   Modules accepted: Orders

## 2023-04-18 NOTE — Progress Notes (Signed)
Subjective:    Patient ID: Diane Martinez, female    DOB: [birth year], 82 y.o.   MRN: [id number]  HPI Here to follow up on HTN. She feels well and her BP is stable.    Review of Systems  Constitutional: Negative.   Respiratory: Negative.    Cardiovascular: Negative.        Objective:   Physical Exam Constitutional:      Appearance: Normal appearance.  Cardiovascular:     Rate and Rhythm: Normal rate and regular rhythm.     Pulses: Normal pulses.     Heart sounds: Normal heart sounds.  Pulmonary:     Effort: Pulmonary effort is normal.     Breath sounds: Normal breath sounds.  Musculoskeletal:     Right lower leg: No edema.     Left lower leg: No edema.  Neurological:     Mental Status: She is alert.           Assessment & Plan:  Her HTN is stable. We will see her back in 6 months.  I reminded her that she is past due to see Dr. Leonides Schanz for her nephrology follow up.  Gershon Crane, MD

## 2023-07-01 ENCOUNTER — Other Ambulatory Visit: Payer: Self-pay | Admitting: Family Medicine

## 2023-07-01 DIAGNOSIS — I10 Essential (primary) hypertension: Secondary | ICD-10-CM

## 2023-09-29 ENCOUNTER — Other Ambulatory Visit: Payer: Self-pay | Admitting: Family Medicine

## 2023-10-03 ENCOUNTER — Ambulatory Visit

## 2023-10-03 ENCOUNTER — Telehealth: Payer: Self-pay

## 2023-10-03 ENCOUNTER — Telehealth: Payer: Self-pay | Admitting: Family Medicine

## 2023-10-03 NOTE — Telephone Encounter (Signed)
Contacted patient on preferred number listed in notes for scheduled AWV. Patient unable to complete visit today, will call back to reschedule.

## 2023-10-05 ENCOUNTER — Other Ambulatory Visit: Payer: Self-pay | Admitting: Family Medicine

## 2023-10-16 ENCOUNTER — Ambulatory Visit: Payer: Medicare PPO | Admitting: Family Medicine

## 2023-10-20 ENCOUNTER — Encounter: Payer: Self-pay | Admitting: Family Medicine

## 2023-10-20 ENCOUNTER — Ambulatory Visit: Admitting: Family Medicine

## 2023-10-20 VITALS — BP 122/80 | HR 63 | Temp 98.1°F | Wt 188.6 lb

## 2023-10-20 DIAGNOSIS — N184 Chronic kidney disease, stage 4 (severe): Secondary | ICD-10-CM | POA: Diagnosis not present

## 2023-10-20 DIAGNOSIS — I10 Essential (primary) hypertension: Secondary | ICD-10-CM | POA: Diagnosis not present

## 2023-10-20 NOTE — Progress Notes (Signed)
   Subjective:    Patient ID: Diane Martinez, female    DOB: July 06, 1941, 83 y.o.   MRN: [id number]  HPI Here to follow up on HTN. She feels well. She has not seen her nephrologist, Dr. Leonides Schanz, for several years.    Review of Systems  Constitutional: Negative.   Respiratory: Negative.    Cardiovascular: Negative.        Objective:   Physical Exam Constitutional:      Appearance: Normal appearance.  Cardiovascular:     Rate and Rhythm: Normal rate and regular rhythm.     Pulses: Normal pulses.     Heart sounds: Normal heart sounds.  Pulmonary:     Effort: Pulmonary effort is normal.     Breath sounds: Normal breath sounds.  Musculoskeletal:     Right lower leg: No edema.     Left lower leg: No edema.  Neurological:     Mental Status: She is alert.           Assessment & Plan:  Her HTN is well controlled. We will refer her back to Dr. Forde Dandy for the CKD. She will return next month for a well exam and fasting labs.  Gershon Crane, MD

## 2023-11-20 ENCOUNTER — Ambulatory Visit: Admitting: Family Medicine

## 2024-01-22 ENCOUNTER — Ambulatory Visit: Admitting: Family Medicine

## 2024-01-22 NOTE — Progress Notes (Deleted)
 PATIENT CHECK-IN and HEALTH RISK ASSESSMENT QUESTIONNAIRE:  -completed by phone/video for upcoming Medicare Preventive Visit  -PLEASE SELECT NOT IN PERSON for the method of visit.   FIRST check to see if the patient completed the online questionnaire - if so, this can be found under the rooming tab, then go to the questionnaires tab. Some of the questions are the same and you can use the answers to complete all of the bold questions below before calling the patient. Question #s below: 6 (fall risk screening), under habits # 1, 2, 3, 8 and 9,  under everyday activities #2, 3 and 8.   Pre-Visit Check-in: 1)Vitals (height, wt, BP, etc) - record in vitals section for visit on day of visit Request home vitals (wt, BP, etc.) and enter into vitals, THEN update Vital Signs SmartPhrase below at the top of the HPI. See below.  2)Review and Update Medications, Allergies PMH, Surgeries, Social history in Epic 3)Hospitalizations in the last year with date/reason? ***  4)Review and Update Care Team (patient's specialists) in Epic 5) Complete PHQ9 in Epic  6) Complete Fall Screening in Epic 7)Review all Health Maintenance Due and order under PCP if not done.  Medicare Wellness Patient Questionnaire:  Answer theses question about your habits: How often do you have a drink containing alcohol?*** How many drinks containing alcohol do you have on a typical day when you are drinking?*** How often do you have six or more drinks on one occasion?*** Have you ever smoked?*** Quit date if applicable? ***  How many packs a day do/did you smoke? *** Do you use smokeless tobacco?*** Do you use an illicit drugs?*** On average, how many days per week do you engage in moderate to strenuous exercise (like a brisk walk)?*** On average, how many minutes do you engage in exercise at this level?*** Are you sexually active? ***Number of partners?*** Typical breakfast**** Typical lunch*** Typical dinner*** Typical  snacks:****  Beverages: ***  Answer theses question about your everyday activities: Can you perform most household chores?*** Are you deaf or have significant trouble hearing?*** Do you feel that you have a problem with memory?*** Do you feel safe at home?*** Last dentist visit?*** 8. Do you have any difficulty performing your everyday activities?*** Are you having any difficulty walking, taking medications on your own, and or difficulty managing daily home needs?*** Do you have difficulty walking or climbing stairs?*** Do you have difficulty dressing or bathing?*** Do you have difficulty doing errands alone such as visiting a doctor's office or shopping?*** Do you currently have any difficulty preparing food and eating?*** Do you currently have any difficulty using the toilet?*** Do you have any difficulty managing your finances?*** Do you have any difficulties with housekeeping of managing your housekeeping?***   Do you have Advanced Directives in place (Living Will, Healthcare Power or Attorney)? ***   Last eye Exam and location?***   Do you currently use prescribed or non-prescribed narcotic or opioid pain medications?***  Do you have a history or close family history of breast, ovarian, tubal or peritoneal cancer or a family member with BRCA (breast cancer susceptibility 1 and 2) gene mutations?  ***Request home vitals (wt, BP, etc.) and enter into vitals, THEN update Vital Signs SmartPhrase below at the top of the HPI. See below.   Nurse/Assistant Credentials/time stamp:    ----------------------------------------------------------------------------------------------------------------------------------------------------------------------------------------------------------------------  Because this visit was a virtual/telehealth visit, some criteria may be missing or patient reported. Any vitals not documented were not able to be obtained and  vitals that have been  documented are patient reported.    MEDICARE ANNUAL PREVENTIVE VISIT WITH PROVIDER: (Welcome to Medicare, initial annual wellness or annual wellness exam)  Virtual Visit via Video***Phone Note  I connected with BRIDGITT RAGGIO on 01/22/24 by phone *** a video enabled telemedicine application and verified that I am speaking with the correct person using two identifiers.  Location patient: home Location provider:work or home office Persons participating in the virtual visit: patient, provider  Concerns and/or follow up today:   See HM section in Epic for other details of completed HM.    ROS: negative for report of fevers, unintentional weight loss, vision changes, vision loss, hearing loss or change, chest pain, sob, hemoptysis, melena, hematochezia, hematuria, falls, bleeding or bruising, thoughts of suicide or self harm, memory loss  Patient-completed extensive health risk assessment - reviewed and discussed with the patient: See Health Risk Assessment completed with patient prior to the visit either above or in recent phone note. This was reviewed in detailed with the patient today and appropriate recommendations, orders and referrals were placed as needed per Summary below and patient instructions.   Review of Medical History: -PMH, PSH, Family History and current specialty and care providers reviewed and updated and listed below   Patient Care Team: Johnny Garnette LABOR, MD as PCP - General   Past Medical History:  Diagnosis Date   Allergy    sees Dr. Sherwood Finn    Chronic kidney disease    sees Dr. Curtis Heman    Hypertension     Past Surgical History:  Procedure Laterality Date   ABDOMINAL HYSTERECTOMY     COLONOSCOPY  11-19-11   per Dr. jakie, benign polyp, repeat in 10 yrs    TONSILLECTOMY      Social History   Socioeconomic History   Marital status: Married    Spouse name: Not on file   Number of children: Not on file   Years of education: Not on  file   Highest education level: Not on file  Occupational History   Not on file  Tobacco Use   Smoking status: Former    Current packs/day: 0.00    Types: Cigarettes    Quit date: 11/04/1968    Years since quitting: 55.2   Smokeless tobacco: Never  Vaping Use   Vaping status: Never Used  Substance and Sexual Activity   Alcohol use: No    Alcohol/week: 0.0 standard drinks of alcohol   Drug use: No   Sexual activity: Not Currently  Other Topics Concern   Not on file  Social History Narrative   Not on file   Social Drivers of Health   Financial Resource Strain: Low Risk  (08/12/2022)   Overall Financial Resource Strain (CARDIA)    Difficulty of Paying Living Expenses: Not hard at all  Food Insecurity: No Food Insecurity (08/12/2022)   Hunger Vital Sign    Worried About Running Out of Food in the Last Year: Never true    Ran Out of Food in the Last Year: Never true  Transportation Needs: No Transportation Needs (08/12/2022)   PRAPARE - Administrator, Civil Service (Medical): No    Lack of Transportation (Non-Medical): No  Physical Activity: Inactive (08/12/2022)   Exercise Vital Sign    Days of Exercise per Week: 0 days    Minutes of Exercise per Session: 0 min  Stress: No Stress Concern Present (08/12/2022)   [employer] of Occupational Health -  Occupational Stress Questionnaire    Feeling of Stress : Not at all  Social Connections: Moderately Integrated (08/12/2022)   Social Connection and Isolation Panel    Frequency of Communication with Friends and Family: More than three times a week    Frequency of Social Gatherings with Friends and Family: More than three times a week    Attends Religious Services: More than 4 times per year    Active Member of [employer] or Organizations: Yes    Attends Banker Meetings: More than 4 times per year    Marital Status: Never married  Intimate Partner Violence: Not At Risk (08/12/2022)   Humiliation, Afraid, Rape,  and Kick questionnaire    Fear of Current or Ex-Partner: No    Emotionally Abused: No    Physically Abused: No    Sexually Abused: No    Family History  Problem Relation Age of Onset   Heart disease Mother    Heart disease Father    Cancer Brother        prostate   Prostate cancer Brother    Diabetes Brother    Diabetes Sister    Diabetes Brother    Colon cancer Neg Hx    Esophageal cancer Neg Hx    Rectal cancer Neg Hx    Stomach cancer Neg Hx     Current Outpatient Medications on File Prior to Visit  Medication Sig Dispense Refill   aspirin  81 MG tablet Take 1 tablet (81 mg total) by mouth daily. 30 tablet 0   atorvastatin  (LIPITOR) 20 MG tablet TAKE 1 TABLET BY MOUTH EVERY DAY 90 tablet 3   Calcium  Carbonate-Vitamin D 600-400 MG-UNIT per tablet Take 1 tablet by mouth daily. 1200 mg     COD LIVER OIL PO Take by mouth daily.     Cyanocobalamin (B-12) 2500 MCG SUBL Place 1,000 mcg under the tongue daily.     fexofenadine  (ALLEGRA  ALLERGY) 60 MG tablet Take 1 tablet (60 mg total) by mouth daily. 90 tablet 3   fish oil-omega-3 fatty acids 1000 MG capsule Take 1 g by mouth daily. 1200 mg     hydrALAZINE  (APRESOLINE ) 50 MG tablet TAKE 1 TABLET BY MOUTH TWICE A DAY 180 tablet 3   hydrochlorothiazide  (HYDRODIURIL ) 25 MG tablet TAKE 1 TABLET (25 MG TOTAL) BY MOUTH DAILY. 90 tablet 0   Multiple Vitamins-Minerals (PRESERVISION AREDS PO) Take by mouth daily.     potassium chloride  (KLOR-CON  M10) 10 MEQ tablet Take 1 tablet (10 mEq total) by mouth 2 (two) times daily. 180 tablet 3   Red Yeast Rice Extract (RED YEAST RICE PO) Take 600 mg by mouth 2 (two) times daily.     No current facility-administered medications on file prior to visit.    No Known Allergies     Physical Exam Vitals requested from patient and listed below if patient had equipment and was able to obtain at home for this virtual visit: There were no vitals filed for this visit. Estimated body mass index is 30.44  kg/m as calculated from the following:   Height as of 09/17/22: 5' 6 (1.676 m).   Weight as of 10/20/23: 188 lb 9.6 oz (85.5 kg).  EKG (optional): deferred due to virtual visit  GENERAL: alert, oriented, no acute distress detected, full vision exam deferred due to pandemic and/or virtual encounter  *** HEENT: atraumatic, conjunttiva clear, no obvious abnormalities on inspection of external nose and ears  NECK: normal movements of the head and  neck  LUNGS: on inspection no signs of respiratory distress, breathing rate appears normal, no obvious gross SOB, gasping or wheezing  CV: no obvious cyanosis  MS: moves all visible extremities without noticeable abnormality  PSYCH/NEURO: pleasant and cooperative, no obvious depression or anxiety, speech and thought processing grossly intact, Cognitive function grossly intact  Flowsheet Row Office Visit from 10/20/2023 in [facility] [place]  PHQ-9 Total Score 0        10/20/2023    9:43 AM 04/18/2023   10:20 AM 09/17/2022   10:25 AM 08/12/2022    3:40 PM 03/19/2022    9:59 AM  Depression screen PHQ 2/9  Decreased Interest 0 0 0 0 3  Down, Depressed, Hopeless 0 0 0 0 0  PHQ - 2 Score 0 0 0 0 3  Altered sleeping 0 0 0  0  Tired, decreased energy 0 0 1  1  Change in appetite 0 0 0  0  Feeling bad or failure about yourself  0 0 0  0  Trouble concentrating 0 0 0  0  Moving slowly or fidgety/restless 0 0 0  0  Suicidal thoughts 0 0 0  0  PHQ-9 Score 0 0 1  4  Difficult doing work/chores Not difficult at all Not difficult at all Not difficult at all  Not difficult at all       03/19/2022    9:59 AM 08/12/2022    3:41 PM 09/17/2022   10:25 AM 04/18/2023   10:20 AM 10/20/2023    9:42 AM  Fall Risk  Falls in the past year? 0 0 0 0 0  Was there an injury with Fall? 0 0 0 0 0  Fall Risk Category Calculator 0 0 0 0 1  Fall Risk Category (Retired) Low       (RETIRED) Patient Fall Risk Level Low fall risk       Patient  at Risk for Falls Due to No Fall Risks No Fall Risks No Fall Risks No Fall Risks No Fall Risks  Fall risk Follow up Falls evaluation completed  Falls prevention discussed  Falls evaluation completed Falls evaluation completed Falls evaluation completed     Data saved with a previous flowsheet row definition     SUMMARY AND PLAN:  No diagnosis found.  Visit coding *** [phone] (annual wellness visit -initial); G0439 (annual wellness subsequent); G0402 Welcome to Medicare(initial preventive physical exam)   Discussed applicable health maintenance/preventive health measures and advised and referred or ordered per patient preferences:  Health Maintenance  Topic Date Due   Zoster Vaccines- Shingrix (1 of 2) Never done   DEXA SCAN  Never done   COVID-19 Vaccine (4 - 2024-25 season) 03/30/2023   Medicare Annual Wellness (AWV)  08/13/2023   INFLUENZA VACCINE  02/27/2024   DTaP/Tdap/Td (2 - Tdap) 01/27/2028   Pneumococcal Vaccine: 50+ Years  Completed   Hepatitis B Vaccines  Aged Out   HPV VACCINES  Aged Out   Meningococcal B Vaccine  Aged [employer] and counseling on the following was provided based on the above review of health and a plan/checklist for the patient, along with additional information discussed, was provided for the patient in the patient instructions :  -Advised on importance of completing advanced directives, discussed options for completing and provided information in patient instructions as well -Provided counseling and plan for difficulty hearing  -Provided counseling and plan for increased risk of falling if  applicable per above screening. Reviewed and demonstrated safe balance exercises that can be done at home to improve balance and discussed exercise guidelines for adults with include balance exercises at least 3 days per week.  -Advised and counseled on a healthy lifestyle - including the importance of a healthy diet, regular physical activity, social  connections and stress management. -Reviewed patient's current diet. Advised and counseled on a whole foods based healthy diet. A summary of a healthy diet was provided in the Patient Instructions.  -reviewed patient's current physical activity level and discussed exercise guidelines for adults. Discussed community resources and ideas for safe exercise at home to assist in meeting exercise guideline recommendations in a safe and healthy way.  -Advise yearly dental visits at minimum and regular eye exams -Advised and counseled on alcohol safe limits, risks/ tobacco use, risks of smoking and offered counseling/help, drug, opoid use/misuse   Follow up: see patient instructions     There are no Patient Instructions on file for this visit.  Chiquita JONELLE Cramp, DO

## 2024-02-07 ENCOUNTER — Other Ambulatory Visit: Payer: Self-pay | Admitting: Family Medicine

## 2024-03-23 ENCOUNTER — Ambulatory Visit: Admitting: Family Medicine

## 2024-03-30 ENCOUNTER — Ambulatory Visit: Admitting: Family Medicine

## 2024-03-30 ENCOUNTER — Encounter: Payer: Self-pay | Admitting: Family Medicine

## 2024-03-30 VITALS — BP 138/62 | HR 58 | Temp 97.7°F | Wt 174.0 lb

## 2024-03-30 DIAGNOSIS — R739 Hyperglycemia, unspecified: Secondary | ICD-10-CM

## 2024-03-30 DIAGNOSIS — N184 Chronic kidney disease, stage 4 (severe): Secondary | ICD-10-CM | POA: Diagnosis not present

## 2024-03-30 DIAGNOSIS — I10 Essential (primary) hypertension: Secondary | ICD-10-CM

## 2024-03-30 DIAGNOSIS — E782 Mixed hyperlipidemia: Secondary | ICD-10-CM | POA: Diagnosis not present

## 2024-03-30 LAB — LIPID PANEL
Cholesterol: 226 mg/dL — ABNORMAL HIGH (ref 0–200)
HDL: 61.4 mg/dL (ref >39.00)
LDL Cholesterol: 153 mg/dL — ABNORMAL HIGH (ref 0–99)
NonHDL: 164.89
Total CHOL/HDL Ratio: 4
Triglycerides: 59 mg/dL (ref 0.0–149.0)
VLDL: 11.8 mg/dL (ref 0.0–40.0)

## 2024-03-30 LAB — BASIC METABOLIC PANEL WITH GFR
BUN: 22 mg/dL (ref 6–23)
CO2: 20 meq/L (ref 19–32)
Calcium: 9.9 mg/dL (ref 8.4–10.5)
Chloride: 103 meq/L (ref 96–112)
Creatinine, Ser: 2.08 mg/dL — ABNORMAL HIGH (ref 0.40–1.20)
GFR: 21.72 mL/min — ABNORMAL LOW (ref >60.00)
Glucose, Bld: 69 mg/dL — ABNORMAL LOW (ref 70–99)
Potassium: 4.2 meq/L (ref 3.5–5.1)
Sodium: 138 meq/L (ref 135–145)

## 2024-03-30 LAB — HEPATIC FUNCTION PANEL
ALT: 14 U/L (ref 0–35)
AST: 22 U/L (ref 0–37)
Albumin: 4.2 g/dL (ref 3.5–5.2)
Alkaline Phosphatase: 106 U/L (ref 39–117)
Bilirubin, Direct: 0.1 mg/dL (ref 0.0–0.3)
Total Bilirubin: 0.5 mg/dL (ref 0.2–1.2)
Total Protein: 8.3 g/dL (ref 6.0–8.3)

## 2024-03-30 LAB — TSH: TSH: 0.61 u[IU]/mL (ref 0.35–5.50)

## 2024-03-30 NOTE — Progress Notes (Signed)
 Subjective:    Patient ID: Diane Martinez, female    DOB: 11-Sep-1940, 83 y.o.   MRN: [id number]  HPI Here to follow up on issues. She feels well. Her BP has been well controlled. At our last visit we renewed a referral to Dr. Federico, her nephrologist. Sherrol says she got a letter from that office but she has not called them back yet about an appointment. She is fasting today for lab work.    Review of Systems  Constitutional: Negative.   HENT: Negative.    Eyes: Negative.   Respiratory: Negative.    Cardiovascular:  Positive for leg swelling.  Gastrointestinal: Negative.   Genitourinary:  Negative for decreased urine volume, difficulty urinating, dyspareunia, dysuria, enuresis, flank pain, frequency, hematuria, pelvic pain and urgency.  Musculoskeletal: Negative.   Skin: Negative.   Neurological: Negative.  Negative for headaches.  Psychiatric/Behavioral: Negative.         Objective:   Physical Exam Constitutional:      General: She is not in acute distress.    Appearance: Normal appearance. She is well-developed.  HENT:     Head: Normocephalic and atraumatic.     Right Ear: External ear normal.     Left Ear: External ear normal.     Nose: Nose normal.     Mouth/Throat:     Pharynx: No oropharyngeal exudate.  Eyes:     General: No scleral icterus.    Conjunctiva/sclera: Conjunctivae normal.     Pupils: Pupils are equal, round, and reactive to light.  Neck:     Thyroid : No thyromegaly.     Vascular: No JVD.  Cardiovascular:     Rate and Rhythm: Normal rate and regular rhythm.     Pulses: Normal pulses.     Heart sounds: Normal heart sounds. No murmur heard.    No friction rub. No gallop.  Pulmonary:     Effort: Pulmonary effort is normal. No respiratory distress.     Breath sounds: Normal breath sounds. No wheezing or rales.  Chest:     Chest wall: No tenderness.  Abdominal:     General: Bowel sounds are normal. There is no distension.     Palpations: Abdomen is  soft. There is no mass.     Tenderness: There is no abdominal tenderness. There is no guarding or rebound.  Musculoskeletal:        General: No tenderness. Normal range of motion.     Cervical back: Normal range of motion and neck supple.     Comments: 1+ edema in both ankles   Lymphadenopathy:     Cervical: No cervical adenopathy.  Skin:    General: Skin is warm and dry.     Findings: No erythema or rash.  Neurological:     General: No focal deficit present.     Mental Status: She is alert and oriented to person, place, and time.     Cranial Nerves: No cranial nerve deficit.     Motor: No abnormal muscle tone.     Coordination: Coordination normal.     Deep Tendon Reflexes: Reflexes are normal and symmetric. Reflexes normal.  Psychiatric:        Mood and Affect: Mood normal.        Behavior: Behavior normal.        Thought Content: Thought content normal.        Judgment: Judgment normal.           Assessment & Plan:  Her HTN is well controlled. We will get labs today to check lipids, renal function, etc so we can follow her stage 4 CKD and dyslipidemia. I urged her to contact Dr. Lafonda office for an appointment asap. We spent a total of (33   ) minutes reviewing records and discussing these issues.  Garnette Olmsted, MD

## 2024-03-31 NOTE — Addendum Note (Signed)
 Addended by: JOHNNY SENIOR A on: 03/31/2024 07:39 AM   Modules accepted: Orders

## 2024-04-01 ENCOUNTER — Ambulatory Visit: Payer: Self-pay | Admitting: Family Medicine

## 2024-09-27 ENCOUNTER — Ambulatory Visit: Admitting: Family Medicine
